# Patient Record
Sex: Female | Born: 1995 | Race: White | Hispanic: No | Marital: Single | Smoking: Never smoker
Health system: Southern US, Community
[De-identification: ages and names within clinical notes are randomized; demographics above are authoritative.]

## PROBLEM LIST (undated history)

## (undated) DIAGNOSIS — Z973 Presence of spectacles and contact lenses: Secondary | ICD-10-CM

## (undated) DIAGNOSIS — N6019 Diffuse cystic mastopathy of unspecified breast: Secondary | ICD-10-CM

## (undated) DIAGNOSIS — N200 Calculus of kidney: Secondary | ICD-10-CM

## (undated) DIAGNOSIS — D563 Thalassemia minor: Secondary | ICD-10-CM

## (undated) DIAGNOSIS — N281 Cyst of kidney, acquired: Secondary | ICD-10-CM

## (undated) DIAGNOSIS — F32A Depression, unspecified: Secondary | ICD-10-CM

## (undated) DIAGNOSIS — F419 Anxiety disorder, unspecified: Secondary | ICD-10-CM

## (undated) DIAGNOSIS — F329 Major depressive disorder, single episode, unspecified: Secondary | ICD-10-CM

## (undated) HISTORY — DX: Presence of spectacles and contact lenses: Z97.3

## (undated) HISTORY — DX: Diffuse cystic mastopathy of unspecified breast: N60.19

## (undated) HISTORY — DX: Depression, unspecified: F32.A

## (undated) HISTORY — PX: TONSILLECTOMY: SUR1361

## (undated) HISTORY — DX: Cyst of kidney, acquired: N28.1

## (undated) HISTORY — DX: Thalassemia minor: D56.3

## (undated) HISTORY — DX: Calculus of kidney: N20.0

## (undated) HISTORY — DX: Anxiety disorder, unspecified: F41.9

## (undated) HISTORY — DX: Major depressive disorder, single episode, unspecified: F32.9

---
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
## 2015-04-17 ENCOUNTER — Telehealth: Payer: Self-pay | Admitting: Hematology

## 2015-04-17 NOTE — Telephone Encounter (Signed)
new patient appt-s/w patient and gave np appt for 09/23 w/10:15 arrival w/Dr. Irene Limbo.  Referring Dr. Ronny Bacon  Dx-Thalassemia    Referral information scanned into EPIC for review

## 2015-05-03 ENCOUNTER — Encounter: Payer: Self-pay | Admitting: Hematology

## 2015-05-03 ENCOUNTER — Ambulatory Visit (HOSPITAL_BASED_OUTPATIENT_CLINIC_OR_DEPARTMENT_OTHER): Payer: BLUE CROSS/BLUE SHIELD | Admitting: Hematology

## 2015-05-03 ENCOUNTER — Telehealth: Payer: Self-pay | Admitting: Hematology

## 2015-05-03 ENCOUNTER — Other Ambulatory Visit (HOSPITAL_BASED_OUTPATIENT_CLINIC_OR_DEPARTMENT_OTHER): Payer: BLUE CROSS/BLUE SHIELD

## 2015-05-03 VITALS — BP 126/79 | HR 89 | Temp 98.4°F | Resp 20 | Ht 60.0 in | Wt 141.7 lb

## 2015-05-03 DIAGNOSIS — D582 Other hemoglobinopathies: Secondary | ICD-10-CM

## 2015-05-03 DIAGNOSIS — Z8489 Family history of other specified conditions: Secondary | ICD-10-CM | POA: Diagnosis not present

## 2015-05-03 DIAGNOSIS — E559 Vitamin D deficiency, unspecified: Secondary | ICD-10-CM | POA: Diagnosis not present

## 2015-05-03 DIAGNOSIS — Z1501 Genetic susceptibility to malignant neoplasm of breast: Secondary | ICD-10-CM

## 2015-05-03 DIAGNOSIS — Z79899 Other long term (current) drug therapy: Secondary | ICD-10-CM

## 2015-05-03 DIAGNOSIS — R5383 Other fatigue: Secondary | ICD-10-CM | POA: Diagnosis not present

## 2015-05-03 LAB — CHCC SMEAR

## 2015-05-03 LAB — CBC & DIFF AND RETIC
BASO%: 0.5 % (ref 0.0–2.0)
BASOS ABS: 0 10*3/uL (ref 0.0–0.1)
EOS%: 0.8 % (ref 0.0–7.0)
Eosinophils Absolute: 0.1 10*3/uL (ref 0.0–0.5)
HEMATOCRIT: 34.9 % (ref 34.8–46.6)
HGB: 11 g/dL — ABNORMAL LOW (ref 11.6–15.9)
Immature Retic Fract: 8.4 % (ref 1.60–10.00)
LYMPH%: 29.4 % (ref 14.0–49.7)
MCH: 19.9 pg — AB (ref 25.1–34.0)
MCHC: 31.5 g/dL (ref 31.5–36.0)
MCV: 63.2 fL — AB (ref 79.5–101.0)
MONO#: 0.4 10*3/uL (ref 0.1–0.9)
MONO%: 4.8 % (ref 0.0–14.0)
NEUT#: 5 10*3/uL (ref 1.5–6.5)
NEUT%: 64.5 % (ref 38.4–76.8)
PLATELETS: 237 10*3/uL (ref 145–400)
RBC: 5.52 10*6/uL — ABNORMAL HIGH (ref 3.70–5.45)
RDW: 15.6 % — AB (ref 11.2–14.5)
RETIC %: 2.11 % — AB (ref 0.70–2.10)
Retic Ct Abs: 116.47 10*3/uL — ABNORMAL HIGH (ref 33.70–90.70)
WBC: 7.8 10*3/uL (ref 3.9–10.3)
lymph#: 2.3 10*3/uL (ref 0.9–3.3)

## 2015-05-03 LAB — COMPREHENSIVE METABOLIC PANEL (CC13)
ALK PHOS: 57 U/L (ref 40–150)
ALT: 16 U/L (ref 0–55)
ANION GAP: 8 meq/L (ref 3–11)
AST: 15 U/L (ref 5–34)
Albumin: 4.6 g/dL (ref 3.5–5.0)
BUN: 11.3 mg/dL (ref 7.0–26.0)
CALCIUM: 9.6 mg/dL (ref 8.4–10.4)
CHLORIDE: 107 meq/L (ref 98–109)
CO2: 27 mEq/L (ref 22–29)
Creatinine: 0.7 mg/dL (ref 0.6–1.1)
Glucose: 91 mg/dl (ref 70–140)
POTASSIUM: 4.1 meq/L (ref 3.5–5.1)
Sodium: 141 mEq/L (ref 136–145)
Total Bilirubin: 1.37 mg/dL — ABNORMAL HIGH (ref 0.20–1.20)
Total Protein: 7.4 g/dL (ref 6.4–8.3)

## 2015-05-03 LAB — IRON AND TIBC CHCC
%SAT: 51 % (ref 21–57)
Iron: 138 ug/dL (ref 41–142)
TIBC: 271 ug/dL (ref 236–444)
UIBC: 134 ug/dL (ref 120–384)

## 2015-05-03 LAB — FERRITIN CHCC: FERRITIN: 86 ng/mL (ref 9–269)

## 2015-05-03 LAB — TSH CHCC: TSH: 1.388 m(IU)/L (ref 0.308–3.960)

## 2015-05-03 NOTE — Telephone Encounter (Signed)
Pt confirmed labs/ov per 09/23 POF, gave pt AVS and Calendar... KJ °

## 2015-05-03 NOTE — Progress Notes (Signed)
Marland Kitchen    HEMATOLOGY/ONCOLOGY CONSULTATION NOTE  Date of Service: 05/03/2015  Patient Care Team: No Pcp Per Patient as PCP - General (General Practice)  CHIEF COMPLAINTS/PURPOSE OF CONSULTATION:  -Establishing care for her hemoglobinopathy -High iron level -Concern for family history of Li-Fraumeni syndrome  HISTORY OF PRESENTING ILLNESS:  Victoria Dawson is a wonderful 19 y.o. female who has been referred to [place] by her [employer] nurse for evaluation and management of her hemoglobinopathy, high iron level and concern for family history of Li-Fraumeni syndrome.  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 She notes that she has never received blood transfusions or IV iron. She notes that her dad also has thalassemia and is of [place]. 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 She has been noted to have fibrocystic breast disease as per her report. Notes some mild intermittent headaches but nothing that has been persistent. Wears glasses with corrective lenses.  She reports that her paternal aunt has Li-Fraumeni syndrome and has had ovarian cancer and Waldenstrm's macroglobulinemia. Paternal uncle had multiple myeloma. Paternal cousin with leukemia.   MEDICAL HISTORY:  Past Medical History  Diagnosis Date  . 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 Kidney stone     Past 3-4 years ago  . Kidney cyst, acquired   . Wears glasses   . Fibrocystic breast changes     SURGICAL HISTORY: Past Surgical History  Procedure Laterality Date  . Tonsillectomy      SOCIAL HISTORY: Social History   Social History  . Marital Status:  Single    Spouse Name: N/A  . Number of Children: N/A  . Years of Education: N/A   Occupational History  . Not on file.   Social History Main Topics  . Smoking status: Never Smoker   . Smokeless tobacco: Never Used  . Alcohol Use: No  . Drug Use: No  . Sexual Activity: Not Currently   Other Topics Concern  . Not on file   Social History Narrative    FAMILY HISTORY: Family History  Problem Relation Age of Onset  . Thalassemia Father   . Throat cancer Father     Reported to be HPV positive  . Li-Fraumeni syndrome Paternal Aunt   . Ovarian cancer Paternal Aunt   . Multiple myeloma Paternal Uncle   . Thalassemia Paternal Grandfather   . Dementia Paternal Grandfather   . Leukemia Cousin     Paternal cousin   Father results of [place] Mother is of [place] and has heart disease and hypertension no blood problems or cancers.  ALLERGIES:  has No Known Allergies.  MEDICATIONS:  No current outpatient prescriptions on file.   No current facility-administered medications for this visit.    REVIEW OF SYSTEMS:    10 Point review of Systems was done is negative except as noted above.  PHYSICAL EXAMINATION: ECOG PERFORMANCE STATUS: 0 - Asymptomatic  . Filed Vitals:   05/03/15 1040  Height: 5' (1.524 m)  Weight: 141 lb 11.2 oz (64.275 kg)   Filed Weights   05/03/15 1040  Weight: 141 lb 11.2  oz (64.275 kg)   .Body mass index is 27.67 kg/(m^2).  GENERAL:alert, in no acute distress and comfortable SKIN: skin color, texture, turgor are normal, no rashes or significant lesions EYES: normal, conjunctiva are pink and non-injected, sclera clear OROPHARYNX:no exudate, no erythema and lips, buccal mucosa, and tongue normal  NECK: supple, no JVD, thyroid normal size, non-tender, without nodularity LYMPH:  no palpable lymphadenopathy in the cervical, axillary or inguinal LUNGS: clear to auscultation with normal respiratory effort HEART: regular rate & rhythm,   no murmurs and no lower extremity edema ABDOMEN: abdomen soft, non-tender, normoactive bowel sounds , no hepatosplenomegaly. Musculoskeletal: no cyanosis of digits and no clubbing  PSYCH: alert & oriented x 3 with fluent speech NEURO: no focal motor/sensory deficits  LABORATORY DATA:  I have reviewed the data as listed  . CBC Latest Ref Rng 05/03/2015 07/18/2009  WBC 3.9 - 10.3 10e3/uL 7.8 12.4  Hemoglobin 11.6 - 15.9 g/dL 11.0(L) 12.1  Hematocrit 34.8 - 46.6 % 34.9 37.7  Platelets 145 - 400 10e3/uL 237 280   . CBC    Component Value Date/Time   WBC 7.8 05/03/2015 1156   WBC 12.4 07/18/2009 2030   RBC 5.52* 05/03/2015 1156   RBC 5.93* 07/18/2009 2030   HGB 11.0* 05/03/2015 1156   HGB 12.1 07/18/2009 2030   HCT 34.9 05/03/2015 1156   HCT 37.7 07/18/2009 2030   PLT 237 05/03/2015 1156   PLT 280 07/18/2009 2030   MCV 63.2* 05/03/2015 1156   MCV 63.6* 07/18/2009 2030   MCH 19.9* 05/03/2015 1156   MCHC 31.5 05/03/2015 1156   MCHC 32.2 07/18/2009 2030   RDW 15.6* 05/03/2015 1156   RDW 14.2 07/18/2009 2030   LYMPHSABS 2.3 05/03/2015 1156   LYMPHSABS 3.2 07/18/2009 2030   MONOABS 0.4 05/03/2015 1156   MONOABS 0.7 07/18/2009 2030   EOSABS 0.1 05/03/2015 1156   EOSABS 0.1 07/18/2009 2030   BASOSABS 0.0 05/03/2015 1156   BASOSABS 0.1 07/18/2009 2030    . CMP Latest Ref Rng 05/03/2015  Glucose 70 - 140 mg/dl 91  BUN 7.0 - 26.0 mg/dL 11.3  Creatinine 0.6 - 1.1 mg/dL 0.7  Sodium 136 - 145 mEq/L 141  Potassium 3.5 - 5.1 mEq/L 4.1  CO2 22 - 29 mEq/L 27  Calcium 8.4 - 10.4 mg/dL 9.6  Total Protein 6.4 - 8.3 g/dL 7.4  Total Bilirubin 0.20 - 1.20 mg/dL 1.37(H)  Alkaline Phos 40 - 150 U/L 57  AST 5 - 34 U/L 15  ALT 0 - 55 U/L 16    . Lab Results  Component Value Date   IRON 138 05/03/2015   TIBC 271 05/03/2015   IRONPCTSAT 51 05/03/2015   (Iron and TIBC)  Lab Results  Component Value Date   FERRITIN 86 05/03/2015   Component     Latest Ref Rng 05/03/2015  TSH      0.308 - 3.960 m(IU)/L 1.388  Vit D, 25-Hydroxy     30 - 100 ng/mL 20 (L)  Vitamin B-12     211 - 911 pg/mL 338  RBC Folate     >280 ng/mL 751   Peripheral Blood smear 04/02/2015: Reviewed by me  Microcytic RBCs with anisocytosis and poikilocytosis and mild polychromasia. Adequate platelets. No platelet clumping. Normal myeloid maturation. No increased blasts.   RADIOGRAPHIC STUDIES: I have personally reviewed the radiological images as listed and agreed with the findings in the report. No results found.  ASSESSMENT & PLAN:   19 year old female of Italian/Irish  ancestry with  1] Microcytic relative polycythemia. With the overall mild anemia hemoglobin of 11 with an MCV in the low 60s. 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 Patient does not have significant anemia at this time. She has never received/required blood transfusions. Never received IV iron. Her ferritin levels are 86 and did not suggest any signs of iron overload at this time. She has not been taking folic acid and with an increase RDW there is a concern that she might be deficient lower levels do not suggest such. B12 levels low normal. Plan -We will recommended the patient continue folic acid 1 mg per day as well as 1 tablet little B complex daily. -Maintain adequate dietary oral iron intake. -Would indeed genetic counseling when she decides to conceive so that her partner is appropriately tested as well.  2] patient reports family history of Li-Fraumeni syndrome in her paternal aunt was a history of ovarian cancer and Waldenstrom's macroglobulinemia. Plan - Patient has been referred to the genetic counselor for appropriate evaluation and testing as indicated. -If she does have a genetic evidence of Li-Fraumeni syndrome we will need to set her up for high risk screening. -Her other family members including her sister might also need to be screened that situation.  #3] vitamin D  deficiency -We'll replace vitamin D with ergocalciferol 50,000 units weekly for 8 doses.  #4] anxiety related to her family genetic history and possibility of having a cancer genetics syndrome. -It appears that it is quite important to the patient to rule out the presence of any cancer genetics syndrome. She has been referred to the genetic counselor for further evaluation. -Recommended maintaining good physical activity level and having open communication with her parents to help with College related stress.  Return to care with Dr. Irene Limbo in 6 weeks to discuss the results of all the above tests and genetic testing results.  Patient's sister will accompanying her for this clinic visit. I answered their list of questions to their apparent satisfaction.  All of the patients questions were answered with apparent satisfaction. The patient knows to call the clinic with any problems, questions or concerns.  I spent 45 minutes counseling the patient face to face. The total time spent in the appointment was 60 minutes and more than 50% was on counseling and direct patient cares.    Sullivan Lone MD [place] [facility]IVMS [facility] Hematology/Oncology Physician [place]  (Office):       [phone] (Work cell):  [phone] (Fax):           [phone]  05/03/2015 10:56 AM

## 2015-05-07 LAB — VITAMIN B12: Vitamin B-12: 338 pg/mL (ref 211–911)

## 2015-05-07 LAB — HEMOGLOBINOPATHY EVALUATION
HGB A: 94.4 % — AB (ref 96.8–97.8)
HGB F QUANT: 0.4 % (ref 0.0–2.0)
HGB S QUANTITAION: 0 %
Hemoglobin Other: 0 %
Hgb A2 Quant: 5.2 % — ABNORMAL HIGH (ref 2.2–3.2)

## 2015-05-07 LAB — VITAMIN D 25 HYDROXY (VIT D DEFICIENCY, FRACTURES): Vit D, 25-Hydroxy: 20 ng/mL — ABNORMAL LOW (ref 30–100)

## 2015-05-07 LAB — FOLATE RBC: RBC Folate: 751 ng/mL (ref >280)

## 2015-05-09 ENCOUNTER — Telehealth: Payer: Self-pay | Admitting: Hematology

## 2015-05-09 NOTE — Telephone Encounter (Signed)
Faxed medical records to [facility] @ [phone]

## 2015-05-10 ENCOUNTER — Encounter: Payer: Self-pay | Admitting: Hematology

## 2015-05-10 MED ORDER — FOLIC ACID 1 MG PO TABS: 1.0000 mg | ORAL_TABLET | Freq: Every day | ORAL | Status: DC

## 2015-05-10 MED ORDER — B COMPLEX VITAMINS PO CAPS: 1.0000 | ORAL_CAPSULE | Freq: Every day | ORAL | Status: DC

## 2015-05-10 MED ORDER — ERGOCALCIFEROL 1.25 MG (50000 UT) PO CAPS
50000.0000 [IU] | ORAL_CAPSULE | ORAL | Status: DC
Start: 2015-05-10 — End: 2015-11-05

## 2015-05-13 ENCOUNTER — Telehealth: Payer: Self-pay | Admitting: *Deleted

## 2015-05-13 ENCOUNTER — Telehealth: Payer: Self-pay | Admitting: Hematology

## 2015-05-13 NOTE — Telephone Encounter (Signed)
per pof to sch pt appt-cld & left pt a message of time & date of sch appt

## 2015-05-13 NOTE — Telephone Encounter (Signed)
Called pt and lvm regarding 3 new rx called into pharmacy by Dr. Irene Limbo.  Instructed to call for any questions.

## 2015-05-27 ENCOUNTER — Telehealth: Payer: Self-pay | Admitting: Genetic Counselor

## 2015-05-27 NOTE — Telephone Encounter (Signed)
LM on VM to see if we could r/s her appointment for 11 AM.  Left CB instructions.

## 2015-05-28 ENCOUNTER — Telehealth: Payer: Self-pay | Admitting: Hematology

## 2015-05-28 NOTE — Telephone Encounter (Signed)
FAXED OVER LAST NOTE (05/03/15) TO [facility]

## 2015-06-03 ENCOUNTER — Encounter: Payer: BLUE CROSS/BLUE SHIELD | Admitting: Genetic Counselor

## 2015-06-03 ENCOUNTER — Other Ambulatory Visit: Payer: BLUE CROSS/BLUE SHIELD

## 2015-06-10 ENCOUNTER — Telehealth: Payer: Self-pay

## 2015-06-10 NOTE — Telephone Encounter (Signed)
Pt calling for interpretation of multiple labs drawn on 05/03/15.

## 2015-06-14 ENCOUNTER — Telehealth: Payer: Self-pay | Admitting: Hematology

## 2015-06-14 NOTE — Telephone Encounter (Signed)
pt left voicemail to see about upcoming appt-gave pttime & dtae of appt

## 2015-06-17 ENCOUNTER — Telehealth: Payer: Self-pay | Admitting: Hematology

## 2015-06-17 ENCOUNTER — Ambulatory Visit (HOSPITAL_BASED_OUTPATIENT_CLINIC_OR_DEPARTMENT_OTHER): Payer: BLUE CROSS/BLUE SHIELD | Admitting: Hematology

## 2015-06-17 ENCOUNTER — Encounter: Payer: Self-pay | Admitting: Hematology

## 2015-06-17 VITALS — BP 136/81 | HR 99 | Temp 98.1°F | Resp 20 | Ht 60.0 in | Wt 146.9 lb

## 2015-06-17 DIAGNOSIS — F419 Anxiety disorder, unspecified: Secondary | ICD-10-CM

## 2015-06-17 DIAGNOSIS — E559 Vitamin D deficiency, unspecified: Secondary | ICD-10-CM | POA: Diagnosis not present

## 2015-06-17 DIAGNOSIS — D563 Thalassemia minor: Secondary | ICD-10-CM | POA: Diagnosis not present

## 2015-06-17 DIAGNOSIS — R5383 Other fatigue: Secondary | ICD-10-CM

## 2015-06-17 NOTE — Telephone Encounter (Signed)
No follow up appointment on 11/7 pof at this time. Patient will call either [facility] or [facility] to establish primary care.

## 2015-07-10 NOTE — Progress Notes (Signed)
Victoria Dawson    HEMATOLOGY/ONCOLOGY CLINIC NOTE  Date of Service:  .06/17/2015  Patient Care Team: No Pcp Per Patient as PCP - General (General Practice)  CHIEF COMPLAINTS/PURPOSE OF CONSULTATION:   F/u for hemoglobinopathy and FHx of ?[name] Syndrome  HPI (Plz see initial consultation for details of initial presenttion)  INTERVAL HISTORY  Patient is here for here scheduled followup. 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 She was noted to be somewhat deficient in vitamin D and is being replaced for this. She also notes that her primary concern recently has been rodent infestation and her rendered apartment and wonders if that can cause any infections. She also appears to have fair amount of studies related stress. She notes that she did get an appointment for the genetic counselor and decided not to proceed with genetic testing at this time. She understands the pros and cons of not pursuing genetic testing. She currently has been getting her primary care through the student medical clinic but would like to set up a primary care physician for which she was given a referral.  MEDICAL HISTORY:  Past Medical History  Diagnosis Date  . 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 Kidney stone     Past 3-4 years ago  . Kidney cyst, acquired   . Wears glasses   . Fibrocystic breast changes     SURGICAL HISTORY: Past Surgical History  Procedure Laterality Date  . Tonsillectomy      SOCIAL HISTORY: Social History   Social History  . Marital Status: Single    Spouse Name: N/A  . Number of Children: N/A  . Years of Education: N/A   Occupational History  . Not on file.   Social History Main Topics  . Smoking status: Never Smoker   . Smokeless tobacco: Never Used  . Alcohol Use: No  . Drug Use: No  . Sexual Activity: Not Currently   Other Topics Concern  . Not on file   Social History Narrative     FAMILY HISTORY: Family History  Problem Relation Age of Onset  . Thalassemia Father   . Throat cancer Father     Reported to be HPV positive  . Li-Fraumeni syndrome Paternal Aunt   . Ovarian cancer Paternal Aunt   . Multiple myeloma Paternal Uncle   . Thalassemia Paternal Grandfather   . Dementia Paternal Grandfather   . Leukemia Cousin     Paternal cousin   Father results of [place] descent Mother is of [place] descent and has heart disease and hypertension no blood problems or cancers.  ALLERGIES:  has No Known Allergies.  MEDICATIONS:  Current Outpatient Prescriptions  Medication Sig Dispense Refill  . b complex vitamins capsule Take 1 capsule by mouth daily. 60 capsule 6  . ergocalciferol (VITAMIN D2) 50000 UNITS capsule Take 1 capsule (50,000 Units total) by mouth once a week. 8 capsule 0  . folic acid (FOLVITE) 1 MG tablet Take 1 tablet (1 mg total) by mouth daily. 60 tablet 6   No current facility-administered medications for this visit.    REVIEW OF SYSTEMS:    10 Point review of Systems was done is negative except as noted above.  PHYSICAL EXAMINATION: ECOG PERFORMANCE STATUS: 0 - Asymptomatic  . Filed Vitals:   06/17/15 1625  Height: 5' (1.524 m)  Weight: 146 lb 14.4 oz (66.633 kg)   Filed Weights   06/17/15 1625  Weight:  146 lb 14.4 oz (66.633 kg)   .Body mass index is 28.69 kg/(m^2).  GENERAL:alert, in no acute distress and comfortable SKIN: skin color, texture, turgor are normal, no rashes or significant lesions EYES: normal, conjunctiva are pink and non-injected, sclera clear OROPHARYNX:no exudate, no erythema and lips, buccal mucosa, and tongue normal  NECK: supple, no JVD, thyroid normal size, non-tender, without nodularity LYMPH:  no palpable lymphadenopathy in the cervical, axillary or inguinal LUNGS: clear to auscultation with normal respiratory effort HEART: regular rate & rhythm,  no murmurs and no lower extremity edema ABDOMEN: abdomen  soft, non-tender, normoactive bowel sounds , no hepatosplenomegaly. Musculoskeletal: no cyanosis of digits and no clubbing  PSYCH: alert & oriented x 3 with fluent speech NEURO: no focal motor/sensory deficits  LABORATORY DATA:  I have reviewed the data as listed  . CBC Latest Ref Rng 05/03/2015 07/18/2009  WBC 3.9 - 10.3 10e3/uL 7.8 12.4  Hemoglobin 11.6 - 15.9 g/dL 11.0(L) 12.1  Hematocrit 34.8 - 46.6 % 34.9 37.7  Platelets 145 - 400 10e3/uL 237 280   . CMP Latest Ref Rng 05/03/2015  Glucose 70 - 140 mg/dl 91  BUN 7.0 - 26.0 mg/dL 11.3  Creatinine 0.6 - 1.1 mg/dL 0.7  Sodium 136 - 145 mEq/L 141  Potassium 3.5 - 5.1 mEq/L 4.1  CO2 22 - 29 mEq/L 27  Calcium 8.4 - 10.4 mg/dL 9.6  Total Protein 6.4 - 8.3 g/dL 7.4  Total Bilirubin 0.20 - 1.20 mg/dL 1.37(H)  Alkaline Phos 40 - 150 U/L 57  AST 5 - 34 U/L 15  ALT 0 - 55 U/L 16    Lab Results  Component Value Date   IRON 138 05/03/2015   TIBC 271 05/03/2015   IRONPCTSAT 51 05/03/2015   (Iron and TIBC)  Lab Results  Component Value Date   FERRITIN 86 05/03/2015   Component     Latest Ref Rng 05/03/2015  TSH     0.308 - 3.960 m(IU)/L 1.388  Vit D, 25-Hydroxy     30 - 100 ng/mL 20 (L)  Vitamin B-12     211 - 911 pg/mL 338  RBC Folate     >280 ng/mL 751      RADIOGRAPHIC STUDIES: I have personally reviewed the radiological images as listed and agreed with the findings in the report. No results found.  ASSESSMENT & PLAN:   19 year old female of Italian/Irish ancestry with  1] Microcytic relative polycythemia. With the overall mild anemia hemoglobin of 11 with an MCV in the low 60s. Hemoglobin electrophoresis is consistent with beta thalassemia trait. Patient does not have significant anemia at this time. She has never received/required blood transfusions. Never received IV iron. Her ferritin levels are 86 and did not suggest any signs of iron overload at this time. She has not been taking folic acid and with an  increase RDW there is a concern that she might be deficient lower levels do not suggest such. B12 levels low normal. Plan -continue folic acid 1 mg per day as well as 1 tablet little B complex daily. -Maintain adequate dietary oral iron intake. -Would indeed genetic counseling when she decides to conceive so that her partner is appropriately tested as well. -Patient will need a primary care physician for continued follow-up and management. Referral has been given for this. 2] patient reports family history of Li-Fraumeni syndrome in her paternal aunt was a history of ovarian cancer and Waldenstrom's macroglobulinemia. Plan - Patient has been referred to the genetic  counselor for appropriate evaluation and testing. After initially being agreeable to do genetic testing she then decided she would not like to proceed with this. She understands the risks of not being screened for this high-risk situation with regards to lack of adequate screening, failure to diagnose early an associated cancer if she did have the mutation etc. she notes that she will think about it in the future after some of her stress from college is better under control but does not want to do this time. She has our contact information in case she changes her mind.  #3] vitamin D deficiency -We'll replace vitamin D with ergocalciferol 50,000 units weekly for 8 doses.  #4] anxiety related to her family genetic history and possibility of having a cancer genetics syndrome. Patient has declined genetic testing at this time but will let us know if she would like this. -Continue to encourage increase physical activity level.  She has been recommended to set up a primary care physician for her ongoing medical care's.  Return to care with Dr. Irene Limbo as needed or if she decides that she would like to proceed with genetic testing.   All of the patients questions were answered with apparent satisfaction. The patient knows to call the clinic  with any problems, questions or concerns.    Sullivan Lone MD [place] [facility]IVMS [facility] Hematology/Oncology Physician [place]  (Office):       [phone] (Work cell):  [phone] (Fax):           [phone]

## 2015-11-05 ENCOUNTER — Other Ambulatory Visit (INDEPENDENT_AMBULATORY_CARE_PROVIDER_SITE_OTHER): Payer: BLUE CROSS/BLUE SHIELD

## 2015-11-05 ENCOUNTER — Ambulatory Visit (INDEPENDENT_AMBULATORY_CARE_PROVIDER_SITE_OTHER): Payer: BLUE CROSS/BLUE SHIELD | Admitting: Internal Medicine

## 2015-11-05 ENCOUNTER — Encounter: Payer: Self-pay | Admitting: Internal Medicine

## 2015-11-05 VITALS — BP 120/60 | HR 78 | Temp 98.4°F | Resp 12 | Ht 60.0 in | Wt 144.0 lb

## 2015-11-05 DIAGNOSIS — Z0000 Encounter for general adult medical examination without abnormal findings: Secondary | ICD-10-CM

## 2015-11-05 DIAGNOSIS — D563 Thalassemia minor: Secondary | ICD-10-CM | POA: Diagnosis not present

## 2015-11-05 HISTORY — DX: Encounter for general adult medical examination without abnormal findings: Z00.00

## 2015-11-05 LAB — CBC
HCT: 34 % — ABNORMAL LOW (ref 36.0–46.0)
Hemoglobin: 10.8 g/dL — ABNORMAL LOW (ref 12.0–15.0)
MCHC: 31.6 g/dL (ref 30.0–36.0)
PLATELETS: 229 10*3/uL (ref 150.0–400.0)
RBC: 5.46 Mil/uL — ABNORMAL HIGH (ref 3.87–5.11)
RDW: 14.5 % (ref 11.5–14.6)
WBC: 7.5 10*3/uL (ref 4.5–10.5)

## 2015-11-05 LAB — COMPREHENSIVE METABOLIC PANEL
ALK PHOS: 37 U/L — AB (ref 39–117)
ALT: 8 U/L (ref 0–35)
AST: 13 U/L (ref 0–37)
Albumin: 4.6 g/dL (ref 3.5–5.2)
BILIRUBIN TOTAL: 0.7 mg/dL (ref 0.2–1.2)
BUN: 12 mg/dL (ref 6–23)
CO2: 27 meq/L (ref 19–32)
Calcium: 9.6 mg/dL (ref 8.4–10.5)
Chloride: 106 mEq/L (ref 96–112)
Creatinine, Ser: 0.75 mg/dL (ref 0.40–1.20)
GFR: 104.06 mL/min (ref >60.00)
GLUCOSE: 121 mg/dL — AB (ref 70–99)
Potassium: 4.4 mEq/L (ref 3.5–5.1)
SODIUM: 139 meq/L (ref 135–145)
TOTAL PROTEIN: 7.4 g/dL (ref 6.0–8.3)

## 2015-11-05 LAB — LIPID PANEL
CHOL/HDL RATIO: 4
Cholesterol: 193 mg/dL (ref 0–200)
HDL: 52.6 mg/dL (ref >39.00)
LDL Cholesterol: 118 mg/dL — ABNORMAL HIGH (ref 0–99)
NONHDL: 140.31
TRIGLYCERIDES: 111 mg/dL (ref 0.0–149.0)
VLDL: 22.2 mg/dL (ref 0.0–40.0)

## 2015-11-05 NOTE — Assessment & Plan Note (Signed)
Checking labs, BP normal, exercises regularly. Non-smoker. Talked to her about sun safety and the need for sunscreen or protective clothing.

## 2015-11-05 NOTE — Patient Instructions (Signed)
We will check the labs today and send the results on mychart.   Come back in about 2 years for a check up and call us sooner if anything pops up sooner.   Health Maintenance, Female Adopting a healthy lifestyle and getting preventive care can go a long way to promote health and wellness. Talk with your health care provider about what schedule of regular examinations is right for you. This is a good chance for you to check in with your provider about disease prevention and staying healthy. In between checkups, there are plenty of things you can do on your own. Experts have done a lot of research about which lifestyle changes and preventive measures are most likely to keep you healthy. Ask your health care provider for more information. WEIGHT AND DIET  Eat a healthy diet  Be sure to include plenty of vegetables, fruits, low-fat dairy products, and lean protein.  Do not eat a lot of foods high in solid fats, added sugars, or salt.  Get regular exercise. This is one of the most important things you can do for your health.  Most adults should exercise for at least 150 minutes each week. The exercise should increase your heart rate and make you sweat (moderate-intensity exercise).  Most adults should also do strengthening exercises at least twice a week. This is in addition to the moderate-intensity exercise.  Maintain a healthy weight  Body mass index (BMI) is a measurement that can be used to identify possible weight problems. It estimates body fat based on height and weight. Your health care provider can help determine your BMI and help you achieve or maintain a healthy weight.  For females 84 years of age and older:   A BMI below 18.5 is considered underweight.  A BMI of 18.5 to 24.9 is normal.  A BMI of 25 to 29.9 is considered overweight.  A BMI of 30 and above is considered obese.  Watch levels of cholesterol and blood lipids  You should start having your blood tested for  lipids and cholesterol at 21 years of age, then have this test every 5 years.  You may need to have your cholesterol levels checked more often if:  Your lipid or cholesterol levels are high.  You are older than 21 years of age.  You are at high risk for heart disease.  CANCER SCREENING   Lung Cancer  Lung cancer screening is recommended for adults 23-101 years old who are at high risk for lung cancer because of a history of smoking.  A yearly low-dose CT scan of the lungs is recommended for people who:  Currently smoke.  Have quit within the past 15 years.  Have at least a 30-pack-year history of smoking. A pack year is smoking an average of one pack of cigarettes a day for 1 year.  Yearly screening should continue until it has been 15 years since you quit.  Yearly screening should stop if you develop a health problem that would prevent you from having lung cancer treatment.  Breast Cancer  Practice breast self-awareness. This means understanding how your breasts normally appear and feel.  It also means doing regular breast self-exams. Let your health care provider know about any changes, no matter how small.  If you are in your 20s or 30s, you should have a clinical breast exam (CBE) by a health care provider every 1-3 years as part of a regular health exam.  If you are 40 or older, have  a CBE every year. Also consider having a breast X-ray (mammogram) every year.  If you have a family history of breast cancer, talk to your health care provider about genetic screening.  If you are at high risk for breast cancer, talk to your health care provider about having an MRI and a mammogram every year.  Breast cancer gene (BRCA) assessment is recommended for women who have family members with BRCA-related cancers. BRCA-related cancers include:  Breast.  Ovarian.  Tubal.  Peritoneal cancers.  Results of the assessment will determine the need for genetic counseling and BRCA1  and BRCA2 testing. Cervical Cancer Your health care provider may recommend that you be screened regularly for cancer of the pelvic organs (ovaries, uterus, and vagina). This screening involves a pelvic examination, including checking for microscopic changes to the surface of your cervix (Pap test). You may be encouraged to have this screening done every 3 years, beginning at age 13.  For women ages 66-65, health care providers may recommend pelvic exams and Pap testing every 3 years, or they may recommend the Pap and pelvic exam, combined with testing for human papilloma virus (HPV), every 5 years. Some types of HPV increase your risk of cervical cancer. Testing for HPV may also be done on women of any age with unclear Pap test results.  Other health care providers may not recommend any screening for nonpregnant women who are considered low risk for pelvic cancer and who do not have symptoms. Ask your health care provider if a screening pelvic exam is right for you.  If you have had past treatment for cervical cancer or a condition that could lead to cancer, you need Pap tests and screening for cancer for at least 20 years after your treatment. If Pap tests have been discontinued, your risk factors (such as having a new sexual partner) need to be reassessed to determine if screening should resume. Some women have medical problems that increase the chance of getting cervical cancer. In these cases, your health care provider may recommend more frequent screening and Pap tests. Colorectal Cancer  This type of cancer can be detected and often prevented.  Routine colorectal cancer screening usually begins at 21 years of age and continues through 21 years of age.  Your health care provider may recommend screening at an earlier age if you have risk factors for colon cancer.  Your health care provider may also recommend using home test kits to check for hidden blood in the stool.  A small camera at the  end of a tube can be used to examine your colon directly (sigmoidoscopy or colonoscopy). This is done to check for the earliest forms of colorectal cancer.  Routine screening usually begins at age 48.  Direct examination of the colon should be repeated every 5-10 years through 21 years of age. However, you may need to be screened more often if early forms of precancerous polyps or small growths are found. Skin Cancer  Check your skin from head to toe regularly.  Tell your health care provider about any new moles or changes in moles, especially if there is a change in a mole's shape or color.  Also tell your health care provider if you have a mole that is larger than the size of a pencil eraser.  Always use sunscreen. Apply sunscreen liberally and repeatedly throughout the day.  Protect yourself by wearing long sleeves, pants, a wide-brimmed hat, and sunglasses whenever you are outside. HEART DISEASE, DIABETES, AND HIGH  BLOOD PRESSURE   High blood pressure causes heart disease and increases the risk of stroke. High blood pressure is more likely to develop in:  People who have blood pressure in the high end of the normal range (130-139/85-89 mm Hg).  People who are overweight or obese.  People who are African American.  If you are 18-39 years of age, have your blood pressure checked every 3-5 years. If you are 40 years of age or older, have your blood pressure checked every year. You should have your blood pressure measured twice--once when you are at a hospital or clinic, and once when you are not at a hospital or clinic. Record the average of the two measurements. To check your blood pressure when you are not at a hospital or clinic, you can use:  An automated blood pressure machine at a pharmacy.  A home blood pressure monitor.  If you are between 55 years and 79 years old, ask your health care provider if you should take aspirin to prevent strokes.  Have regular diabetes  screenings. This involves taking a blood sample to check your fasting blood sugar level.  If you are at a normal weight and have a low risk for diabetes, have this test once every three years after 21 years of age.  If you are overweight and have a high risk for diabetes, consider being tested at a younger age or more often. PREVENTING INFECTION  Hepatitis B  If you have a higher risk for hepatitis B, you should be screened for this virus. You are considered at high risk for hepatitis B if:  You were born in a country where hepatitis B is common. Ask your health care provider which countries are considered high risk.  Your parents were born in a high-risk country, and you have not been immunized against hepatitis B (hepatitis B vaccine).  You have HIV or AIDS.  You use needles to inject street drugs.  You live with someone who has hepatitis B.  You have had sex with someone who has hepatitis B.  You get hemodialysis treatment.  You take certain medicines for conditions, including cancer, organ transplantation, and autoimmune conditions. Hepatitis C  Blood testing is recommended for:  Everyone born from 1945 through 1965.  Anyone with known risk factors for hepatitis C. Sexually transmitted infections (STIs)  You should be screened for sexually transmitted infections (STIs) including gonorrhea and chlamydia if:  You are sexually active and are younger than 21 years of age.  You are older than 21 years of age and your health care provider tells you that you are at risk for this type of infection.  Your sexual activity has changed since you were last screened and you are at an increased risk for chlamydia or gonorrhea. Ask your health care provider if you are at risk.  If you do not have HIV, but are at risk, it may be recommended that you take a prescription medicine daily to prevent HIV infection. This is called pre-exposure prophylaxis (PrEP). You are considered at risk  if:  You are sexually active and do not regularly use condoms or know the HIV status of your partner(s).  You take drugs by injection.  You are sexually active with a partner who has HIV. Talk with your health care provider about whether you are at high risk of being infected with HIV. If you choose to begin PrEP, you should first be tested for HIV. You should then be tested every   3 months for as long as you are taking PrEP.  PREGNANCY   If you are premenopausal and you may become pregnant, ask your health care provider about preconception counseling.  If you may become pregnant, take 400 to 800 micrograms (mcg) of folic acid every day.  If you want to prevent pregnancy, talk to your health care provider about birth control (contraception). OSTEOPOROSIS AND MENOPAUSE   Osteoporosis is a disease in which the bones lose minerals and strength with aging. This can result in serious bone fractures. Your risk for osteoporosis can be identified using a bone density scan.  If you are 55 years of age or older, or if you are at risk for osteoporosis and fractures, ask your health care provider if you should be screened.  Ask your health care provider whether you should take a calcium or vitamin D supplement to lower your risk for osteoporosis.  Menopause may have certain physical symptoms and risks.  Hormone replacement therapy may reduce some of these symptoms and risks. Talk to your health care provider about whether hormone replacement therapy is right for you.  HOME CARE INSTRUCTIONS   Schedule regular health, dental, and eye exams.  Stay current with your immunizations.   Do not use any tobacco products including cigarettes, chewing tobacco, or electronic cigarettes.  If you are pregnant, do not drink alcohol.  If you are breastfeeding, limit how much and how often you drink alcohol.  Limit alcohol intake to no more than 1 drink per day for nonpregnant women. One drink equals 12  ounces of beer, 5 ounces of wine, or 1 ounces of hard liquor.  Do not use street drugs.  Do not share needles.  Ask your health care provider for help if you need support or information about quitting drugs.  Tell your health care provider if you often feel depressed.  Tell your health care provider if you have ever been abused or do not feel safe at home.   This information is not intended to replace advice given to you by your health care provider. Make sure you discuss any questions you have with your health care provider.   Document Released: 02/09/2011 Document Revised: 08/17/2014 Document Reviewed: 06/28/2013 Elsevier Interactive Patient Education [employer].

## 2015-11-05 NOTE — Progress Notes (Signed)
   Subjective:    Patient ID: Victoria Dawson, female    DOB: 29-Aug-1995, 20 y.o.   MRN: [id number]  HPI The patient is a new 20 YO female coming in for wellness. No new complaints or concerns. 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   PMH, [facility], social history reviewed and updated.   Review of Systems  Constitutional: Negative for fever, activity change, appetite change, fatigue and unexpected weight change.  HENT: Negative.   Eyes: Negative.   Respiratory: Negative for cough, chest tightness, shortness of breath and wheezing.   Cardiovascular: Negative for chest pain, palpitations and leg swelling.  Gastrointestinal: Negative for nausea, abdominal pain, diarrhea, constipation and abdominal distention.  Musculoskeletal: Negative.   Skin: Negative.   Neurological: Negative.   Psychiatric/Behavioral: Negative.       Objective:   Physical Exam  Constitutional: She is oriented to person, place, and time. She appears well-developed and well-nourished.  HENT:  Head: Normocephalic and atraumatic.  Eyes: EOM are normal.  Neck: Normal range of motion.  Cardiovascular: Normal rate and regular rhythm.   Pulmonary/Chest: Effort normal and breath sounds normal. No respiratory distress. She has no wheezes. She has no rales.  Abdominal: Soft. Bowel sounds are normal. She exhibits no distension. There is no tenderness. There is no rebound.  Musculoskeletal: She exhibits no edema.  Neurological: She is alert and oriented to person, place, and time. Coordination normal.  Skin: Skin is warm and dry.  Psychiatric: She has a normal mood and affect.   Filed Vitals:   11/05/15 0858  BP: 120/60  Pulse: 78  Temp: 98.4 F (36.9 C)  TempSrc: Oral  Resp: 12  Height: 5' (1.524 m)  Weight: 144 lb (65.318 kg)  SpO2: 98%      Assessment & Plan:

## 2015-11-05 NOTE — Progress Notes (Signed)
Pre visit review using our clinic review tool, if applicable. No additional management support is needed unless otherwise documented below in the visit note. 

## 2015-11-05 NOTE — Assessment & Plan Note (Signed)
She is taking multivitamin and checking CBC today. No problems and no history of transfusion.

## 2015-11-06 LAB — HIV ANTIBODY (ROUTINE TESTING W REFLEX): HIV: NONREACTIVE

## 2016-01-30 ENCOUNTER — Other Ambulatory Visit: Payer: Self-pay | Admitting: Hematology

## 2016-02-18 ENCOUNTER — Ambulatory Visit: Payer: BLUE CROSS/BLUE SHIELD | Admitting: Internal Medicine

## 2016-02-18 ENCOUNTER — Telehealth: Payer: Self-pay | Admitting: Internal Medicine

## 2016-02-18 NOTE — Telephone Encounter (Signed)
Patient Name: Victoria Dawson NE DOB: 09-17-1995 Initial Comment Caller states she twice had a little blood in stool this morning, with nausea and abd pain. Nurse Assessment Nurse: Vallery Sa, RN, Cathy Date/Time (Eastern Time): 02/18/2016 8:55:08 AM Confirm and document reason for call. If symptomatic, describe symptoms. You must click the next button to save text entered. ---Caller states she developed lower bilateral abdominal pain this morning (rated as a 1/2 on the 1 to 10 scale earlier this morning, but no pain at this time). No injury in the past 3 days., No fever. She developed blood in her stool this morning. Alert and responsive. Has the patient traveled out of the country within the last 30 days? ---No Does the patient have any new or worsening symptoms? ---Yes Will a triage be completed? ---Yes Related visit to physician within the last 2 weeks? ---No Does the PT have any chronic conditions? (i.e. diabetes, asthma, etc.) ---Yes List chronic conditions. ---Thalasemmia Is the patient pregnant or possibly pregnant? (Ask all females between the ages of 1-55) ---No Is this a behavioral health or substance abuse call? ---No Guidelines Guideline Title Affirmed Question Affirmed Notes Rectal Bleeding MODERATE rectal bleeding (small blood clots, passing blood without stool, or toilet water turns red) Final Disposition User See Physician within 24 Hours [place], RN, [employer] states she has a 5pm appointment scheduled for tomorrow and she declined an earlier appointment at [facility]. Encouraged to call back wtih any concerns or questions. Referrals REFERRED TO PCP OFFICE Disagree/Comply: Comply

## 2016-02-19 ENCOUNTER — Ambulatory Visit: Payer: BLUE CROSS/BLUE SHIELD | Admitting: Internal Medicine

## 2016-07-26 ENCOUNTER — Emergency Department (HOSPITAL_COMMUNITY)
Admission: EM | Admit: 2016-07-26 | Discharge: 2016-07-27 | Disposition: A | Discharge status: Home / Self Care | Payer: BLUE CROSS/BLUE SHIELD | Attending: Emergency Medicine | Admitting: Emergency Medicine

## 2016-07-26 ENCOUNTER — Encounter (HOSPITAL_COMMUNITY): Payer: Self-pay

## 2016-07-26 ENCOUNTER — Emergency Department (HOSPITAL_COMMUNITY): Payer: BLUE CROSS/BLUE SHIELD

## 2016-07-26 DIAGNOSIS — R079 Chest pain, unspecified: Secondary | ICD-10-CM

## 2016-07-26 DIAGNOSIS — R072 Precordial pain: Secondary | ICD-10-CM | POA: Diagnosis not present

## 2016-07-26 LAB — BASIC METABOLIC PANEL
Anion gap: 8 (ref 5–15)
BUN: 8 mg/dL (ref 6–20)
CALCIUM: 10 mg/dL (ref 8.9–10.3)
CO2: 27 mmol/L (ref 22–32)
CREATININE: 0.66 mg/dL (ref 0.44–1.00)
Chloride: 105 mmol/L (ref 101–111)
GFR calc non Af Amer: >60 mL/min (ref >60)
Glucose, Bld: 92 mg/dL (ref 65–99)
Potassium: 3.6 mmol/L (ref 3.5–5.1)
Sodium: 140 mmol/L (ref 135–145)

## 2016-07-26 LAB — CBC
HCT: 34 % — ABNORMAL LOW (ref 36.0–46.0)
Hemoglobin: 11 g/dL — ABNORMAL LOW (ref 12.0–15.0)
MCH: 20 pg — AB (ref 26.0–34.0)
MCHC: 32.4 g/dL (ref 30.0–36.0)
MCV: 61.8 fL — ABNORMAL LOW (ref 78.0–100.0)
PLATELETS: 218 10*3/uL (ref 150–400)
RBC: 5.5 MIL/uL — AB (ref 3.87–5.11)
RDW: 14.8 % (ref 11.5–15.5)
WBC: 9.9 10*3/uL (ref 4.0–10.5)

## 2016-07-26 LAB — I-STAT TROPONIN, ED: TROPONIN I, POC: 0 ng/mL (ref 0.00–0.08)

## 2016-07-26 LAB — POC URINE PREG, ED: Preg Test, Ur: NEGATIVE

## 2016-07-26 MED ORDER — KETOROLAC TROMETHAMINE 30 MG/ML IJ SOLN
30.0000 mg | Freq: Once | INTRAMUSCULAR | Status: DC
  Filled 2016-07-26: qty 1

## 2016-07-26 NOTE — ED Triage Notes (Signed)
Pt complaining of central chest pressure and shortness of breath x 30 mins. Pt denies any cough or lightheadedness/dizziness. Pt denies any fevers.

## 2016-07-26 NOTE — ED Provider Notes (Signed)
[facility] DEPT Provider Note   CSN: [id number] Arrival date & time: 07/26/16  2152  History   Chief Complaint Chief Complaint  Patient presents with  . Chest Pain  . Shortness of Breath    HPI Victoria Dawson is a 20 y.o. female.  HPI  20 y.o. female with a hx beta thalassemia minor, presents to the Emergency Department today complaining of chest pain and shortness of breath with onset x 30 min ago. Pt states that she was folding laundry when she felt a sudden substernal chest pain that lasted 30 minutes. Noted dull ache that she rated 5/10 and constant. Had radiation into right side of neck that has remained. No syncope. No dizziness. No lightheadedness. No N/V. No diaphoresis. No hx same. No fevers. No URI symptoms. No trauma to area. No other symptoms noted. Pt states pain improving in ED without intervention.   Past Medical History:  Diagnosis Date  . Anxiety   . 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 Depression   . Fibrocystic breast changes   . Kidney cyst, acquired   . Kidney stone    Past 3-4 years ago  . Wears glasses     Patient Active Problem List   Diagnosis Date Noted  . Routine general medical examination at a health care facility 11/05/2015  . 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 TONSILLECTOMY      OB History    No data available       Home Medications    Prior to Admission medications   Medication Sig Start Date End Date Taking? Authorizing Provider  Norethin Ace-Eth Estrad-FE (BLISOVI 24 FE PO) Take by mouth.    Historical Provider, MD    Family History Family History  Problem Relation Age of Onset  . Thalassemia Father   . Throat cancer Father     Reported to be HPV positive  . Li-Fraumeni syndrome Paternal Aunt   . Ovarian cancer Paternal Aunt   . Leukemia Cousin     Paternal cousin  . Multiple myeloma Paternal Uncle   . Thalassemia Paternal  Grandfather   . Dementia Paternal Grandfather     Social History Social History  Substance Use Topics  . Smoking status: Never Smoker  . Smokeless tobacco: Never Used  . Alcohol use No     Allergies   Patient has no known allergies.   Review of Systems Review of Systems ROS reviewed and all are negative for acute change except as noted in the HPI.  Physical Exam Updated Vital Signs BP 142/92   Pulse 90   Temp 98.2 F (36.8 C) (Oral)   Resp 13   LMP 01/25/2016 Comment: irregular menstural periods/ on pill  SpO2 100%   Physical Exam  Constitutional: She is oriented to person, place, and time. Vital signs are normal. She appears well-developed and well-nourished.  HENT:  Head: Normocephalic and atraumatic.  Right Ear: Hearing normal.  Left Ear: Hearing normal.  Eyes: Conjunctivae and EOM are normal. Pupils are equal, round, and reactive to light.  Neck: Trachea normal, normal range of motion and full passive range of motion without pain. Neck supple. Normal carotid pulses and no JVD present. No tracheal tenderness present. Carotid bruit is not present.  Cardiovascular: Normal rate, regular rhythm, normal heart sounds and intact distal pulses.   Pulmonary/Chest: Effort normal and breath sounds normal. No respiratory distress. She  has no wheezes. She has no rales. She exhibits no tenderness.  Abdominal: Soft.  Musculoskeletal: Normal range of motion.  Neurological: She is alert and oriented to person, place, and time.  Skin: Skin is warm and dry.  Psychiatric: She has a normal mood and affect. Her speech is normal and behavior is normal. Thought content normal.  Nursing note and vitals reviewed.  ED Treatments / Results  Labs (all labs ordered are listed, but only abnormal results are displayed) Labs Reviewed  CBC - Abnormal; Notable for the following:       Result Value   RBC 5.50 (*)    Hemoglobin 11.0 (*)    HCT 34.0 (*)    MCV 61.8 (*)    MCH 20.0 (*)    All  other components within normal limits  BASIC METABOLIC PANEL  I-STAT TROPOININ, ED  POC URINE PREG, ED    EKG  EKG Interpretation None       Radiology Dg Chest 2 View  Result Date: 07/26/2016 CLINICAL DATA:  20 year old female with central chest pain. EXAM: CHEST  2 VIEW COMPARISON:  None. FINDINGS: The heart size and mediastinal contours are within normal limits. Both lungs are clear. The visualized skeletal structures are unremarkable. IMPRESSION: No active cardiopulmonary disease. Electronically Signed   By: Anner Crete M.D.   On: 07/26/2016 23:43    Procedures Procedures (including critical care time)  Medications Ordered in ED Medications - No data to display   Initial Impression / Assessment and Plan / ED Course  I have reviewed the triage vital signs and the nursing notes.  Pertinent labs & imaging results that were available during my care of the patient were reviewed by me and considered in my medical decision making (see chart for details).  Clinical Course    Final Clinical Impressions(s) / ED Diagnoses  {I have reviewed and evaluated the relevant laboratory values. {I have reviewed and evaluated the relevant imaging studies. {I have interpreted the relevant EKG. {I have reviewed the relevant previous healthcare records.  {I obtained HPI from historian.   ED Course:  Assessment: Pt is a 20yF presents with CP x 30 min ago. Resolving in ED without intervention. Risk Factors none. Does have hx Beta Thalassemia minor. Given toradol in ED. Patient is to be discharged with recommendation to follow up with PCP in regards to today's hospital visit. Chest pain is not likely of cardiac or pulmonary etiology d/t presentation, perc negative, VSS, no tracheal deviation, no JVD or new murmur, RRR, breath sounds equal bilaterally, EKG without acute abnormalities, negative troponin, and negative CXR. Heart Score 0. Likely musculoskeletal. Pt has been advised start a NSAIDs and  return to the ED is CP becomes exertional, associated with diaphoresis or nausea, radiates to left jaw/arm, worsens or becomes concerning in any way. Pt appears reliable for follow up and is agreeable to discharge. Patient is in no acute distress. Vital Signs are stable. Patient is able to ambulate. Patient able to tolerate PO.  Disposition/Plan:  DC Home Additional Verbal discharge instructions given and discussed with patient.  Pt Instructed to f/u with PCP in the next week for evaluation and treatment of symptoms. Return precautions given Pt acknowledges and agrees with plan  Supervising Physician Nat Christen, MD  Final diagnoses:  Chest pain, unspecified type    New Prescriptions New Prescriptions   No medications on file     Shary Decamp, PA-C 07/27/16 0015    Nat Christen, MD 07/31/16 [phone]

## 2016-07-27 MED ORDER — IBUPROFEN 600 MG PO TABS: 600.0000 mg | ORAL_TABLET | Freq: Four times a day (QID) | ORAL | 0 refills | Status: DC | PRN

## 2016-07-27 NOTE — Discharge Instructions (Signed)
Please read and follow all provided instructions.  Your diagnoses today include:  1. Chest pain, unspecified type     Tests performed today include: An EKG of your heart A chest x-ray Cardiac enzymes - a blood test for heart muscle damage Blood counts and electrolytes Vital signs. See below for your results today.   Medications prescribed:   Take any prescribed medications only as directed.  Follow-up instructions: Please follow-up with your primary care provider as soon as you can for further evaluation of your symptoms.   Return instructions:  SEEK IMMEDIATE MEDICAL ATTENTION IF: You have severe chest pain, especially if the pain is crushing or pressure-like and spreads to the arms, back, neck, or jaw, or if you have sweating, nausea (feeling sick to your stomach), or shortness of breath. THIS IS AN EMERGENCY. Don't wait to see if the pain will go away. Get medical help at once. Call 911 or 0 (operator). DO NOT drive yourself to the hospital.  Your chest pain gets worse and does not go away with rest.  You have an attack of chest pain lasting longer than usual, despite rest and treatment with the medications your caregiver has prescribed.  You wake from sleep with chest pain or shortness of breath. You feel dizzy or faint. You have chest pain not typical of your usual pain for which you originally saw your caregiver.  You have any other emergent concerns regarding your health.  Additional Information: Chest pain comes from many different causes. Your caregiver has diagnosed you as having chest pain that is not specific for one problem, but does not require admission.  You are at low risk for an acute heart condition or other serious illness.   Your vital signs today were: BP 132/92    Pulse 79    Temp 98.2 F (36.8 C) (Oral)    Resp 17    LMP 01/25/2016 Comment: irregular menstural periods/ on pill   SpO2 99%  If your blood pressure (BP) was elevated above 135/85 this visit,  please have this repeated by your doctor within one month. --------------

## 2016-11-13 ENCOUNTER — Ambulatory Visit (INDEPENDENT_AMBULATORY_CARE_PROVIDER_SITE_OTHER): Payer: BLUE CROSS/BLUE SHIELD | Admitting: Internal Medicine

## 2016-11-13 ENCOUNTER — Encounter: Payer: Self-pay | Admitting: Internal Medicine

## 2016-11-13 DIAGNOSIS — D563 Thalassemia minor: Secondary | ICD-10-CM | POA: Diagnosis not present

## 2016-11-13 DIAGNOSIS — Z0000 Encounter for general adult medical examination without abnormal findings: Secondary | ICD-10-CM | POA: Diagnosis not present

## 2016-11-13 NOTE — Assessment & Plan Note (Signed)
Stable, prior CBC consistent.

## 2016-11-13 NOTE — Patient Instructions (Signed)
Health Maintenance, Female Adopting a healthy lifestyle and getting preventive care can go a long way to promote health and wellness. Talk with your health care provider about what schedule of regular examinations is right for you. This is a good chance for you to check in with your provider about disease prevention and staying healthy. In between checkups, there are plenty of things you can do on your own. Experts have done a lot of research about which lifestyle changes and preventive measures are most likely to keep you healthy. Ask your health care provider for more information. Weight and diet Eat a healthy diet  Be sure to include plenty of vegetables, fruits, low-fat dairy products, and lean protein.  Do not eat a lot of foods high in solid fats, added sugars, or salt.  Get regular exercise. This is one of the most important things you can do for your health.  Most adults should exercise for at least 150 minutes each week. The exercise should increase your heart rate and make you sweat (moderate-intensity exercise).  Most adults should also do strengthening exercises at least twice a week. This is in addition to the moderate-intensity exercise. Maintain a healthy weight  Body mass index (BMI) is a measurement that can be used to identify possible weight problems. It estimates body fat based on height and weight. Your health care provider can help determine your BMI and help you achieve or maintain a healthy weight.  For females 76 years of age and older:  A BMI below 18.5 is considered underweight.  A BMI of 18.5 to 24.9 is normal.  A BMI of 25 to 29.9 is considered overweight.  A BMI of 30 and above is considered obese. Watch levels of cholesterol and blood lipids  You should start having your blood tested for lipids and cholesterol at 22 years of age, then have this test every 5 years.  You may need to have your cholesterol levels checked more often if:  Your lipid or  cholesterol levels are high.  You are older than 22 years of age.  You are at high risk for heart disease. Cancer screening Lung Cancer  Lung cancer screening is recommended for adults 64-42 years old who are at high risk for lung cancer because of a history of smoking.  A yearly low-dose CT scan of the lungs is recommended for people who:  Currently smoke.  Have quit within the past 15 years.  Have at least a 30-pack-year history of smoking. A pack year is smoking an average of one pack of cigarettes a day for 1 year.  Yearly screening should continue until it has been 15 years since you quit.  Yearly screening should stop if you develop a health problem that would prevent you from having lung cancer treatment. Breast Cancer  Practice breast self-awareness. This means understanding how your breasts normally appear and feel.  It also means doing regular breast self-exams. Let your health care provider know about any changes, no matter how small.  If you are in your 20s or 30s, you should have a clinical breast exam (CBE) by a health care provider every 1-3 years as part of a regular health exam.  If you are 34 or older, have a CBE every year. Also consider having a breast X-ray (mammogram) every year.  If you have a family history of breast cancer, talk to your health care provider about genetic screening.  If you are at high risk for breast cancer, talk  to your health care provider about having an MRI and a mammogram every year.  Breast cancer gene (BRCA) assessment is recommended for women who have family members with BRCA-related cancers. BRCA-related cancers include:  Breast.  Ovarian.  Tubal.  Peritoneal cancers.  Results of the assessment will determine the need for genetic counseling and BRCA1 and BRCA2 testing. Cervical Cancer  Your health care provider may recommend that you be screened regularly for cancer of the pelvic organs (ovaries, uterus, and vagina).  This screening involves a pelvic examination, including checking for microscopic changes to the surface of your cervix (Pap test). You may be encouraged to have this screening done every 3 years, beginning at age 24.  For women ages 66-65, health care providers may recommend pelvic exams and Pap testing every 3 years, or they may recommend the Pap and pelvic exam, combined with testing for human papilloma virus (HPV), every 5 years. Some types of HPV increase your risk of cervical cancer. Testing for HPV may also be done on women of any age with unclear Pap test results.  Other health care providers may not recommend any screening for nonpregnant women who are considered low risk for pelvic cancer and who do not have symptoms. Ask your health care provider if a screening pelvic exam is right for you.  If you have had past treatment for cervical cancer or a condition that could lead to cancer, you need Pap tests and screening for cancer for at least 20 years after your treatment. If Pap tests have been discontinued, your risk factors (such as having a new sexual partner) need to be reassessed to determine if screening should resume. Some women have medical problems that increase the chance of getting cervical cancer. In these cases, your health care provider may recommend more frequent screening and Pap tests. Colorectal Cancer  This type of cancer can be detected and often prevented.  Routine colorectal cancer screening usually begins at 22 years of age and continues through 22 years of age.  Your health care provider may recommend screening at an earlier age if you have risk factors for colon cancer.  Your health care provider may also recommend using home test kits to check for hidden blood in the stool.  A small camera at the end of a tube can be used to examine your colon directly (sigmoidoscopy or colonoscopy). This is done to check for the earliest forms of colorectal cancer.  Routine  screening usually begins at age 41.  Direct examination of the colon should be repeated every 5-10 years through 22 years of age. However, you may need to be screened more often if early forms of precancerous polyps or small growths are found. Skin Cancer  Check your skin from head to toe regularly.  Tell your health care provider about any new moles or changes in moles, especially if there is a change in a mole's shape or color.  Also tell your health care provider if you have a mole that is larger than the size of a pencil eraser.  Always use sunscreen. Apply sunscreen liberally and repeatedly throughout the day.  Protect yourself by wearing long sleeves, pants, a wide-brimmed hat, and sunglasses whenever you are outside. Heart disease, diabetes, and high blood pressure  High blood pressure causes heart disease and increases the risk of stroke. High blood pressure is more likely to develop in:  People who have blood pressure in the high end of the normal range (130-139/85-89 mm Hg).  People who are overweight or obese.  People who are African American.  If you are 59-24 years of age, have your blood pressure checked every 3-5 years. If you are 34 years of age or older, have your blood pressure checked every year. You should have your blood pressure measured twice-once when you are at a hospital or clinic, and once when you are not at a hospital or clinic. Record the average of the two measurements. To check your blood pressure when you are not at a hospital or clinic, you can use:  An automated blood pressure machine at a pharmacy.  A home blood pressure monitor.  If you are between 29 years and 60 years old, ask your health care provider if you should take aspirin to prevent strokes.  Have regular diabetes screenings. This involves taking a blood sample to check your fasting blood sugar level.  If you are at a normal weight and have a low risk for diabetes, have this test once  every three years after 22 years of age.  If you are overweight and have a high risk for diabetes, consider being tested at a younger age or more often. Preventing infection Hepatitis B  If you have a higher risk for hepatitis B, you should be screened for this virus. You are considered at high risk for hepatitis B if:  You were born in a country where hepatitis B is common. Ask your health care provider which countries are considered high risk.  Your parents were born in a high-risk country, and you have not been immunized against hepatitis B (hepatitis B vaccine).  You have HIV or AIDS.  You use needles to inject street drugs.  You live with someone who has hepatitis B.  You have had sex with someone who has hepatitis B.  You get hemodialysis treatment.  You take certain medicines for conditions, including cancer, organ transplantation, and autoimmune conditions. Hepatitis C  Blood testing is recommended for:  Everyone born from [birth year] through 1966.  Anyone with known risk factors for hepatitis C. Sexually transmitted infections (STIs)  You should be screened for sexually transmitted infections (STIs) including gonorrhea and chlamydia if:  You are sexually active and are younger than 22 years of age.  You are older than 22 years of age and your health care provider tells you that you are at risk for this type of infection.  Your sexual activity has changed since you were last screened and you are at an increased risk for chlamydia or gonorrhea. Ask your health care provider if you are at risk.  If you do not have HIV, but are at risk, it may be recommended that you take a prescription medicine daily to prevent HIV infection. This is called pre-exposure prophylaxis (PrEP). You are considered at risk if:  You are sexually active and do not regularly use condoms or know the HIV status of your partner(s).  You take drugs by injection.  You are sexually active with a partner  who has HIV. Talk with your health care provider about whether you are at high risk of being infected with HIV. If you choose to begin PrEP, you should first be tested for HIV. You should then be tested every 3 months for as long as you are taking PrEP. Pregnancy  If you are premenopausal and you may become pregnant, ask your health care provider about preconception counseling.  If you may become pregnant, take 400 to 800 micrograms (mcg) of folic acid  every day.  If you want to prevent pregnancy, talk to your health care provider about birth control (contraception). Osteoporosis and menopause  Osteoporosis is a disease in which the bones lose minerals and strength with aging. This can result in serious bone fractures. Your risk for osteoporosis can be identified using a bone density scan.  If you are 4 years of age or older, or if you are at risk for osteoporosis and fractures, ask your health care provider if you should be screened.  Ask your health care provider whether you should take a calcium or vitamin D supplement to lower your risk for osteoporosis.  Menopause may have certain physical symptoms and risks.  Hormone replacement therapy may reduce some of these symptoms and risks. Talk to your health care provider about whether hormone replacement therapy is right for you. Follow these instructions at home:  Schedule regular health, dental, and eye exams.  Stay current with your immunizations.  Do not use any tobacco products including cigarettes, chewing tobacco, or electronic cigarettes.  If you are pregnant, do not drink alcohol.  If you are breastfeeding, limit how much and how often you drink alcohol.  Limit alcohol intake to no more than 1 drink per day for nonpregnant women. One drink equals 12 ounces of beer, 5 ounces of wine, or 1 ounces of hard liquor.  Do not use street drugs.  Do not share needles.  Ask your health care provider for help if you need support  or information about quitting drugs.  Tell your health care provider if you often feel depressed.  Tell your health care provider if you have ever been abused or do not feel safe at home. This information is not intended to replace advice given to you by your health care provider. Make sure you discuss any questions you have with your health care provider. Document Released: 02/09/2011 Document Revised: 01/02/2016 Document Reviewed: 04/30/2015 Elsevier Interactive Patient Education  2017 [employer].

## 2016-11-13 NOTE — Progress Notes (Signed)
Pre visit review using our clinic review tool, if applicable. No additional management support is needed unless otherwise documented below in the visit note. 

## 2016-11-13 NOTE — Assessment & Plan Note (Signed)
Pap smear from gynecology, labs not needed today. Counseled about sun safety and mole surveillance. Given screening recommendations. Tetanus up to date. Declines flu shot.

## 2016-11-13 NOTE — Progress Notes (Signed)
   Subjective:    Patient ID: Victoria Dawson, female    DOB: October 20, 1995, 21 y.o.   MRN: [id number]  HPI The patient is a 21 YO female coming in for wellness. No new concerns. Some headaches which feel like her head is empty. Sometimes starts with late meals.   PMH, [facility], social history reviewed and updated.   Review of Systems  Constitutional: Negative.   HENT: Negative.   Eyes: Negative.   Respiratory: Negative for cough, chest tightness and shortness of breath.   Cardiovascular: Negative for chest pain, palpitations and leg swelling.  Gastrointestinal: Negative for abdominal distention, abdominal pain, constipation, diarrhea, nausea and vomiting.  Musculoskeletal: Negative.   Skin: Negative.   Neurological: Negative.   Psychiatric/Behavioral: Negative.       Objective:   Physical Exam  Constitutional: She is oriented to person, place, and time. She appears well-developed and well-nourished.  HENT:  Head: Normocephalic and atraumatic.  Eyes: EOM are normal.  Neck: Normal range of motion.  Cardiovascular: Normal rate and regular rhythm.   Pulmonary/Chest: Effort normal and breath sounds normal. No respiratory distress. She has no wheezes. She has no rales.  Abdominal: Soft. Bowel sounds are normal. She exhibits no distension. There is no tenderness. There is no rebound.  Musculoskeletal: She exhibits no edema.  Neurological: She is alert and oriented to person, place, and time. Coordination normal.  Skin: Skin is warm and dry.  Psychiatric: She has a normal mood and affect.   Vitals:   11/13/16 1434  BP: 120/68  Pulse: 86  Resp: 12  Temp: 98.2 F (36.8 C)  TempSrc: Oral  SpO2: 98%  Weight: 149 lb (67.6 kg)  Height: 5' (1.524 m)      Assessment & Plan:

## 2016-12-24 ENCOUNTER — Ambulatory Visit (INDEPENDENT_AMBULATORY_CARE_PROVIDER_SITE_OTHER): Payer: BLUE CROSS/BLUE SHIELD | Admitting: Nurse Practitioner

## 2016-12-24 ENCOUNTER — Other Ambulatory Visit (INDEPENDENT_AMBULATORY_CARE_PROVIDER_SITE_OTHER): Payer: BLUE CROSS/BLUE SHIELD

## 2016-12-24 ENCOUNTER — Encounter: Payer: Self-pay | Admitting: Nurse Practitioner

## 2016-12-24 VITALS — BP 120/74 | HR 105 | Temp 99.5°F | Ht 60.0 in | Wt 153.0 lb

## 2016-12-24 DIAGNOSIS — W57XXXA Bitten or stung by nonvenomous insect and other nonvenomous arthropods, initial encounter: Secondary | ICD-10-CM

## 2016-12-24 DIAGNOSIS — B351 Tinea unguium: Secondary | ICD-10-CM

## 2016-12-24 DIAGNOSIS — B081 Molluscum contagiosum: Secondary | ICD-10-CM | POA: Diagnosis not present

## 2016-12-24 LAB — HEPATIC FUNCTION PANEL
ALK PHOS: 52 U/L (ref 39–117)
ALT: 37 U/L — ABNORMAL HIGH (ref 0–35)
AST: 18 U/L (ref 0–37)
Albumin: 4.9 g/dL (ref 3.5–5.2)
BILIRUBIN DIRECT: 0.2 mg/dL (ref 0.0–0.3)
Total Bilirubin: 0.7 mg/dL (ref 0.2–1.2)
Total Protein: 7.6 g/dL (ref 6.0–8.3)

## 2016-12-24 MED ORDER — TERBINAFINE HCL 250 MG PO TABS: 250.0000 mg | ORAL_TABLET | Freq: Every day | ORAL | 2 refills | Status: DC

## 2016-12-24 NOTE — Progress Notes (Signed)
Subjective:  Patient ID: Victoria Dawson, female    DOB: August 30, 1995  Age: 21 y.o. MRN: [id number]  CC: Nail Problem (black spot on left big toe--going on for 2 mo/ lyme disease consult--alot of tick at her house?)   Rash  This is a new problem. The current episode started 1 to 4 weeks ago. The problem is unchanged. The affected locations include the right lower leg. She was exposed to nothing. Associated symptoms include nail changes. Pertinent negatives include no anorexia, congestion, cough, diarrhea, eye pain, facial edema, fatigue, fever, joint pain, rhinorrhea, shortness of breath, sore throat or vomiting. Past treatments include nothing. There is no history of allergies, asthma, eczema or varicella.   She will like to be tested for lyme disease. She has tick infestation in her home due [place] and outside pets. She suspects she has had a tick bite in past.  Nail discoloration: Onset 43months ago. Has been getting worse. No pain, redness or swelling. No nail injury. Sexually active with use of condoms. LMP 2weeks ago.  No outpatient prescriptions prior to visit.   No facility-administered medications prior to visit.     ROS See HPI  Objective:  BP 120/74   Pulse (!) 105   Temp 99.5 F (37.5 C)   Ht 5' (1.524 m)   Wt 153 lb (69.4 kg)   SpO2 94%   BMI 29.88 kg/m   BP Readings from Last 3 Encounters:  12/24/16 120/74  11/13/16 120/68  07/27/16 113/70    Wt Readings from Last 3 Encounters:  12/24/16 153 lb (69.4 kg)  11/13/16 149 lb (67.6 kg)  11/05/15 144 lb (65.3 kg)    Physical Exam  Constitutional: She is oriented to person, place, and time. No distress.  Cardiovascular: Normal rate and regular rhythm.   Pulmonary/Chest: Effort normal.  Musculoskeletal: She exhibits no edema or tenderness.  Neurological: She is alert and oriented to person, place, and time.  Skin: Skin is warm and dry. Rash noted. Rash is papular. No erythema.    Multiple Discrete pink rounded dome-shaped papules on right LE.  Left Great toe: hyperkeratosis, yellow discoloration, partial seperation from nailbed.  Vitals reviewed.   Lab Results  Component Value Date   WBC 9.9 07/26/2016   HGB 11.0 (L) 07/26/2016   HCT 34.0 (L) 07/26/2016   PLT 218 07/26/2016   GLUCOSE 92 07/26/2016   CHOL 193 11/05/2015   TRIG 111.0 11/05/2015   HDL 52.60 11/05/2015   LDLCALC 118 (H) 11/05/2015   ALT 8 11/05/2015   AST 13 11/05/2015   NA 140 07/26/2016   K 3.6 07/26/2016   CL 105 07/26/2016   CREATININE 0.66 07/26/2016   BUN 8 07/26/2016   CO2 27 07/26/2016   TSH 1.388 05/03/2015    Dg Chest 2 View  Result Date: 07/26/2016 CLINICAL DATA:  21 year old female with central chest pain. EXAM: CHEST  2 VIEW COMPARISON:  None. FINDINGS: The heart size and mediastinal contours are within normal limits. Both lungs are clear. The visualized skeletal structures are unremarkable. IMPRESSION: No active cardiopulmonary disease. Electronically Signed   By: Anner Crete M.D.   On: 07/26/2016 23:43    Assessment & Plan:   Victoria Dawson was seen today for nail problem.  Diagnoses and all orders for this visit:  Onychomycosis of left great toe -     terbinafine (LAMISIL) 250 MG tablet; Take 1 tablet (250 mg total) by mouth daily. -  Basic metabolic panel; Future -     Hepatic function panel; Future  Molluscum contagiosum -     Hepatic function panel; Future  Tick bite, initial encounter -     Lyme Ab/Western Blot Reflex   I am having Victoria Dawson start on terbinafine.  Meds ordered this encounter  Medications  . terbinafine (LAMISIL) 250 MG tablet    Sig: Take 1 tablet (250 mg total) by mouth daily.    Dispense:  30 tablet    Refill:  2    Order Specific Question:   Supervising Provider    Answer:   Cassandria Anger [[id number]]    Follow-up: Return in about 4 weeks (around 01/21/2017), or if symptoms worsen or fail to improve, for with  pcp.  Wilfred Lacy, NP

## 2016-12-24 NOTE — Patient Instructions (Addendum)
Return to lab for repeat hepatic panel in 1week.  Terbinafine tablets What is this medicine? TERBINAFINE (TER bin a feen) is an antifungal medicine. It is used to treat certain kinds of fungal or yeast infections. This medicine may be used for other purposes; ask your health care provider or pharmacist if you have questions. COMMON BRAND NAME(S): Lamisil, Terbinex What should I tell my health care provider before I take this medicine? They need to know if you have any of these conditions: -drink alcoholic beverages -kidney disease -liver disease -an unusual or allergic reaction to terbinafine, other medicines, foods, dyes, or preservatives -pregnant or trying to get pregnant -breast-feeding How should I use this medicine? Take this medicine by mouth with a full glass of water. Follow the directions on the prescription label. You can take this medicine with food or on an empty stomach. Take your medicine at regular intervals. Do not take your medicine more often than directed. Do not skip doses or stop your medicine early even if you feel better. Do not stop taking except on your doctor's advice. Talk to your pediatrician regarding the use of this medicine in children. Special care may be needed. Overdosage: If you think you have taken too much of this medicine contact a poison control center or emergency room at once. NOTE: This medicine is only for you. Do not share this medicine with others. What if I miss a dose? If you miss a dose, take it as soon as you can. If it is almost time for your next dose, take only that dose. Do not take double or extra doses. What may interact with this medicine? Do not take this medicine with any of the following medications: -thioridazine This medicine may also interact with the following medications: -beta-blockers -caffeine -cimetidine -cyclosporine -medicines for depression, anxiety, or psychotic disturbances -medicines for fungal infections like  fluconazole and ketoconazole -medicines for irregular heartbeat like amiodarone, flecainide and propafenone -rifampin -warfarin This list may not describe all possible interactions. Give your health care provider a list of all the medicines, herbs, non-prescription drugs, or dietary supplements you use. Also tell them if you smoke, drink alcohol, or use illegal drugs. Some items may interact with your medicine. What should I watch for while using this medicine? Visit your doctor or health care provider regularly. Tell your doctor right away if you have nausea or vomiting, loss of appetite, stomach pain on your right upper side, yellow skin, dark urine, light stools, or are over tired. Some fungal infections need many weeks or months of treatment to cure. If you are taking this medicine for a long time, you will need to have important blood work done. What side effects may I notice from receiving this medicine? Side effects that you should report to your doctor or health care professional as soon as possible: -allergic reactions like skin rash or hives, swelling of the face, lips, or tongue -changes in vision -dark urine -fever or infection -general ill feeling or flu-like symptoms -light-colored stools -loss of appetite, nausea -redness, blistering, peeling or loosening of the skin, including inside the mouth -right upper belly pain -unusually weak or tired -yellowing of the eyes or skin Side effects that usually do not require medical attention (report to your doctor or health care professional if they continue or are bothersome): -changes in taste -diarrhea -hair loss -muscle or joint pain -stomach gas -stomach upset This list may not describe all possible side effects. Call your doctor for medical advice about  side effects. You may report side effects to FDA at 1-800-FDA-1088. Where should I keep my medicine? Keep out of the reach of children. Store at room temperature below 25  degrees C (77 degrees F). Protect from light. Throw away any unused medicine after the expiration date. NOTE: This sheet is a summary. It may not cover all possible information. If you have questions about this medicine, talk to your doctor, pharmacist, or health care provider.  2018 Elsevier/Gold Standard (2007-10-07 16:28:07)

## 2016-12-25 LAB — LYME AB/WESTERN BLOT REFLEX

## 2016-12-31 ENCOUNTER — Other Ambulatory Visit (INDEPENDENT_AMBULATORY_CARE_PROVIDER_SITE_OTHER): Payer: BLUE CROSS/BLUE SHIELD

## 2016-12-31 DIAGNOSIS — B351 Tinea unguium: Secondary | ICD-10-CM | POA: Diagnosis not present

## 2016-12-31 LAB — BASIC METABOLIC PANEL
BUN: 8 mg/dL (ref 6–23)
CO2: 27 meq/L (ref 19–32)
Calcium: 9.6 mg/dL (ref 8.4–10.5)
Chloride: 103 mEq/L (ref 96–112)
Creatinine, Ser: 0.77 mg/dL (ref 0.40–1.20)
GFR: 99.83 mL/min (ref >60.00)
GLUCOSE: 129 mg/dL — AB (ref 70–99)
POTASSIUM: 3.6 meq/L (ref 3.5–5.1)
SODIUM: 136 meq/L (ref 135–145)

## 2016-12-31 LAB — HEPATIC FUNCTION PANEL
ALBUMIN: 4.7 g/dL (ref 3.5–5.2)
ALT: 16 U/L (ref 0–35)
AST: 14 U/L (ref 0–37)
Alkaline Phosphatase: 47 U/L (ref 39–117)
Bilirubin, Direct: 0.2 mg/dL (ref 0.0–0.3)
TOTAL PROTEIN: 7.2 g/dL (ref 6.0–8.3)
Total Bilirubin: 0.8 mg/dL (ref 0.2–1.2)

## 2017-01-05 ENCOUNTER — Encounter: Payer: Self-pay | Admitting: Family Medicine

## 2017-01-05 ENCOUNTER — Ambulatory Visit (INDEPENDENT_AMBULATORY_CARE_PROVIDER_SITE_OTHER): Payer: BLUE CROSS/BLUE SHIELD | Admitting: Family Medicine

## 2017-01-05 VITALS — BP 115/96 | HR 109 | Temp 98.6°F | Ht 60.0 in | Wt 152.0 lb

## 2017-01-05 DIAGNOSIS — K625 Hemorrhage of anus and rectum: Secondary | ICD-10-CM

## 2017-01-05 NOTE — Progress Notes (Signed)
   Subjective:    Patient ID: Victoria Dawson, female    DOB: Sep 06, 1995, 21 y.o.   MRN: [id number]  HPI Here to discuss one week of intermittent abdominal bloating, cramps, and passing loose stools. Then last night while passing a loose stool she saw some bright red blood in the toilet. The stool was easy to pass and she did not strain  . She notes that 2 weeks ago she started taking Terbinafine for toenail fungus and she has read it can have  GI side effects.    Review of Systems  Constitutional: Negative.   Respiratory: Negative.   Cardiovascular: Negative.   Gastrointestinal: Positive for abdominal pain, blood in stool and diarrhea. Negative for abdominal distention, constipation, nausea, rectal pain and vomiting.  Genitourinary: Negative.        Objective:   Physical Exam  Constitutional: She appears well-developed and well-nourished.  Cardiovascular: Normal rate, regular rhythm, normal heart sounds and intact distal pulses.   Pulmonary/Chest: Effort normal and breath sounds normal. She has no wheezes. She has no rales.  Abdominal: Soft. Bowel sounds are normal. She exhibits no distension and no mass. There is no tenderness. There is no rebound and no guarding.  Genitourinary:  Genitourinary Comments: Anus appears normal with no fissures or hemorrhoids           Assessment & Plan:  These may well be side effects of Terbinafine. We agreed for her to stop taking it. She will observe her symptoms for the next week or two and recheck if things do not improve.  Alysia Penna, MD

## 2017-01-05 NOTE — Patient Instructions (Signed)
WE NOW OFFER   [facility] FAST TRACK!!!  SAME DAY Appointments for ACUTE CARE  Such as: Sprains, Injuries, cuts, abrasions, rashes, muscle pain, joint pain, back pain Colds, flu, sore throats, headache, allergies, cough, fever  Ear pain, sinus and eye infections Abdominal pain, nausea, vomiting, diarrhea, upset stomach Animal/insect bites  3 Easy Ways to Schedule: Walk-In Scheduling Call in scheduling Mychart Sign-up: [email or web address]         

## 2017-01-07 ENCOUNTER — Ambulatory Visit: Payer: BLUE CROSS/BLUE SHIELD | Admitting: Internal Medicine

## 2017-01-07 ENCOUNTER — Ambulatory Visit: Payer: BLUE CROSS/BLUE SHIELD | Admitting: Adult Health

## 2017-01-08 ENCOUNTER — Telehealth: Payer: Self-pay | Admitting: Internal Medicine

## 2017-01-08 NOTE — Telephone Encounter (Signed)
Pt saw Dr Alysia Penna today and he suggested that she change from the terbinafine to something topical and told her to contact us to make the change... Please advise.

## 2017-01-12 NOTE — Telephone Encounter (Signed)
We did not prescribe or see her for this. Would recommend just to stop lamisil. Most topicals are not effective.

## 2017-01-12 NOTE — Telephone Encounter (Signed)
LVM with note and stated to call back if she has any questions

## 2017-01-28 ENCOUNTER — Encounter: Payer: Self-pay | Admitting: Family Medicine

## 2017-01-28 ENCOUNTER — Ambulatory Visit (INDEPENDENT_AMBULATORY_CARE_PROVIDER_SITE_OTHER): Payer: BLUE CROSS/BLUE SHIELD | Admitting: Family Medicine

## 2017-01-28 VITALS — BP 110/70 | HR 86 | Wt 153.8 lb

## 2017-01-28 DIAGNOSIS — Z7689 Persons encountering health services in other specified circumstances: Secondary | ICD-10-CM

## 2017-01-28 DIAGNOSIS — B351 Tinea unguium: Secondary | ICD-10-CM

## 2017-01-28 NOTE — Patient Instructions (Signed)
You will receive a call from [place].

## 2017-01-28 NOTE — Progress Notes (Signed)
   Subjective:    Patient ID: Victoria Dawson, female    DOB: 11/26/1995, 21 y.o.   MRN: [id number]  HPI Chief Complaint  Patient presents with  . new pt    new pt, toenail fungus. had this a couple months   She is here to establish care.  Previous medical care- Dr. Sharlet Salina at [facility] [place].   States she has a history of fungal infection to her left great toe for 3-4 months. Would like to know treatment options. States she had side effects to oral terbafine- headache and GI upset and these symptoms resolved after she stopped the medication 3 weeks ago.  States her previous PCP told her that topical antifungals do not work for her problem and she now wants to know what I recommend.   Denies fever, chills, N/V/D.   Reviewed allergies, medications, past medical, surgical,  and social history.   Review of Systems Pertinent positives and negatives in the history of present illness.     Objective:   Physical Exam BP 110/70   Pulse 86   Wt 153 lb 12.8 oz (69.8 kg)   BMI 30.04 kg/m   Left great toenail with yellowish discoloration and partial nail attachment. No erythema, edema or drainage. No other nail involvement.       Assessment & Plan:  Onychomycosis of left great toe - Plan: Ambulatory referral to Podiatry  Encounter to establish care  Discussed that she may try topical antifungal but this has not been proven to be effective in the past and is generally a lengthy process. She appears to have had some negative affects from oral terbafine so I do not recommend trying an oral medication at this point. Offered a referral to podiatry and she would like to proceed. Referral made.  She will follow up as needed.

## 2017-03-20 ENCOUNTER — Ambulatory Visit (INDEPENDENT_AMBULATORY_CARE_PROVIDER_SITE_OTHER): Payer: BLUE CROSS/BLUE SHIELD | Admitting: Family Medicine

## 2017-03-20 ENCOUNTER — Encounter: Payer: Self-pay | Admitting: Family Medicine

## 2017-03-20 VITALS — BP 112/70 | HR 89 | Temp 98.4°F | Wt 158.5 lb

## 2017-03-20 DIAGNOSIS — B081 Molluscum contagiosum: Secondary | ICD-10-CM | POA: Diagnosis not present

## 2017-03-20 DIAGNOSIS — L03115 Cellulitis of right lower limb: Secondary | ICD-10-CM

## 2017-03-20 DIAGNOSIS — L02415 Cutaneous abscess of right lower limb: Secondary | ICD-10-CM | POA: Insufficient documentation

## 2017-03-20 MED ORDER — CEPHALEXIN 500 MG PO CAPS: 500.0000 mg | ORAL_CAPSULE | Freq: Three times a day (TID) | ORAL | 0 refills | Status: DC

## 2017-03-20 NOTE — Patient Instructions (Addendum)
Wash with warm soapy water.  Do warm compresses 3-4 times daily.  Complete antibiotics.  Call if redness spreading or fever.   Cellulitis, Adult Cellulitis is a skin infection. The infected area is usually red and tender. This condition occurs most often in the arms and lower legs. The infection can travel to the muscles, blood, and underlying tissue and become serious. It is very important to get treated for this condition. What are the causes? Cellulitis is caused by bacteria. The bacteria enter through a break in the skin, such as a cut, burn, insect bite, open sore, or crack. What increases the risk? This condition is more likely to occur in people who:  Have a weak defense system (immune system).  Have open wounds on the skin such as cuts, burns, bites, and scrapes. Bacteria can enter the body through these open wounds.  Are older.  Have diabetes.  Have a type of long-lasting (chronic) liver disease (cirrhosis) or kidney disease.  Use IV drugs.  What are the signs or symptoms? Symptoms of this condition include:  Redness, streaking, or spotting on the skin.  Swollen area of the skin.  Tenderness or pain when an area of the skin is touched.  Warm skin.  Fever.  Chills.  Blisters.  How is this diagnosed? This condition is diagnosed based on a medical history and physical exam. You may also have tests, including:  Blood tests.  Lab tests.  Imaging tests.  How is this treated? Treatment for this condition may include:  Medicines, such as antibiotic medicines or antihistamines.  Supportive care, such as rest and application of cold or warm cloths (cold or warm compresses) to the skin.  Hospital care, if the condition is severe.  The infection usually gets better within 1-2 days of treatment. Follow these instructions at home:  Take over-the-counter and prescription medicines only as told by your health care provider.  If you were prescribed an  antibiotic medicine, take it as told by your health care provider. Do not stop taking the antibiotic even if you start to feel better.  Drink enough fluid to keep your urine clear or pale yellow.  Do not touch or rub the infected area.  Raise (elevate) the infected area above the level of your heart while you are sitting or lying down.  Apply warm or cold compresses to the affected area as told by your health care provider.  Keep all follow-up visits as told by your health care provider. This is important. These visits let your health care provider make sure a more serious infection is not developing. Contact a health care provider if:  You have a fever.  Your symptoms do not improve within 1-2 days of starting treatment.  Your bone or joint underneath the infected area becomes painful after the skin has healed.  Your infection returns in the same area or another area.  You notice a swollen bump in the infected area.  You develop new symptoms.  You have a general ill feeling (malaise) with muscle aches and pains. Get help right away if:  Your symptoms get worse.  You feel very sleepy.  You develop vomiting or diarrhea that persists.  You notice red streaks coming from the infected area.  Your red area gets larger or turns dark in color. This information is not intended to replace advice given to you by your health care provider. Make sure you discuss any questions you have with your health care provider. Document Released: 05/06/2005  Document Revised: 12/05/2015 Document Reviewed: 06/05/2015 Elsevier Interactive Patient Education  2017 [employer].

## 2017-03-20 NOTE — Progress Notes (Signed)
   Subjective:    Patient ID: Victoria Dawson, female    DOB: 30-Sep-1995, 20 y.o.   MRN: [id number]  HPI   21 year old female presents with new skin lesion.  She reports noting a skin lesion on right anterior lower leg..  looked like her typical molluscum contagiosum. In last week, she may have scratched the area.. Noted redness and pus, coming from lesion. Tender lesion, warm. Has noted yellow clear discharge, occ blood.  Cleaning with soap and water. HAs not put anything on the area.    No fever, no flu like symptoms.  Nml po intake.  No personal or family history of boils or MRSA.  No recent antibiotics. Does not work in [employer].  Social History /Family History/Past Medical History reviewed in detail and updated in EMR if needed. Blood pressure 112/70, pulse 89, temperature 98.4 F (36.9 C), temperature source Oral, weight 158 lb 8 oz (71.9 kg), last menstrual period 02/27/2017, SpO2 97 %.   Review of Systems  Constitutional: Negative for fatigue and fever.  HENT: Negative for ear pain.   Eyes: Negative for pain.  Respiratory: Negative for chest tightness and shortness of breath.   Cardiovascular: Negative for chest pain, palpitations and leg swelling.  Gastrointestinal: Negative for abdominal pain.  Genitourinary: Negative for dysuria.       Objective:   Physical Exam  Constitutional: Vital signs are normal. She appears well-developed and well-nourished. She is cooperative.  Non-toxic appearance. She does not appear ill. No distress.  HENT:  Head: Normocephalic.  Right Ear: Hearing, tympanic membrane, external ear and ear canal normal. Tympanic membrane is not erythematous, not retracted and not bulging.  Left Ear: Hearing, tympanic membrane, external ear and ear canal normal. Tympanic membrane is not erythematous, not retracted and not bulging.  Nose: No mucosal edema or rhinorrhea. Right sinus exhibits no maxillary sinus tenderness and no frontal sinus  tenderness. Left sinus exhibits no maxillary sinus tenderness and no frontal sinus tenderness.  Mouth/Throat: Uvula is midline, oropharynx is clear and moist and mucous membranes are normal.  Eyes: Pupils are equal, round, and reactive to light. Conjunctivae, EOM and lids are normal. Lids are everted and swept, no foreign bodies found.  Neck: Trachea normal and normal range of motion. Neck supple. Carotid bruit is not present. No thyroid mass and no thyromegaly present.  Cardiovascular: Normal rate, regular rhythm, S1 normal, S2 normal, normal heart sounds, intact distal pulses and normal pulses.  Exam reveals no gallop and no friction rub.   No murmur heard. Pulmonary/Chest: Effort normal and breath sounds normal. No tachypnea. No respiratory distress. She has no decreased breath sounds. She has no wheezes. She has no rhonchi. She has no rales.  Abdominal: Soft. Normal appearance and bowel sounds are normal. There is no tenderness.  Neurological: She is alert.  Skin: Skin is warm, dry and intact. Rash noted.   Molluscum lesion right anterior leg with surrounding erythema and warmth, no fluctuance, small scab superiorly.  3 cm diameter total of erythema.  Psychiatric: Her speech is normal and behavior is normal. Judgment and thought content normal. Her mood appears not anxious. Cognition and memory are normal. She does not exhibit a depressed mood.          Assessment & Plan:

## 2017-03-25 ENCOUNTER — Encounter: Payer: Self-pay | Admitting: Family

## 2017-03-25 ENCOUNTER — Ambulatory Visit (INDEPENDENT_AMBULATORY_CARE_PROVIDER_SITE_OTHER): Payer: BLUE CROSS/BLUE SHIELD | Admitting: Family

## 2017-03-25 VITALS — BP 128/82 | HR 99 | Temp 98.8°F | Resp 16 | Ht 60.0 in | Wt 159.0 lb

## 2017-03-25 DIAGNOSIS — K2100 Gastro-esophageal reflux disease with esophagitis, without bleeding: Secondary | ICD-10-CM

## 2017-03-25 DIAGNOSIS — Z833 Family history of diabetes mellitus: Secondary | ICD-10-CM | POA: Insufficient documentation

## 2017-03-25 LAB — POCT GLYCOSYLATED HEMOGLOBIN (HGB A1C): HEMOGLOBIN A1C: 4.9

## 2017-03-25 NOTE — Patient Instructions (Signed)
Thank you for choosing [facility].  SUMMARY AND INSTRUCTIONS:  Try either Pepid (Famotidine) or Zantac  TUMS as needed.  A1c was 4.9 which is exceptional!   Follow up:  If your symptoms worsen or fail to improve, please contact our office for further instruction, or in case of emergency go directly to the emergency room at the closest medical facility.    Heartburn Heartburn is a type of pain or discomfort that can happen in the throat or chest. It is often described as a burning pain. It may also cause a bad taste in the mouth. Heartburn may feel worse when you lie down or bend over. It may be caused by stomach contents that move back up (reflux) into the tube that connects the mouth with the stomach (esophagus). Follow these instructions at home: Take these actions to lessen your discomfort and to help avoid problems. Diet  Follow a diet as told by your doctor. You may need to avoid foods and drinks such as: ? Coffee and tea (with or without caffeine). ? Drinks that contain alcohol. ? Energy drinks and sports drinks. ? Carbonated drinks or sodas. ? Chocolate and cocoa. ? Peppermint and mint flavorings. ? Garlic and onions. ? Horseradish. ? Spicy and acidic foods, such as peppers, chili powder, curry powder, vinegar, hot sauces, and BBQ sauce. ? Citrus fruit juices and citrus fruits, such as oranges, lemons, and limes. ? Tomato-based foods, such as red sauce, chili, salsa, and pizza with red sauce. ? Fried and fatty foods, such as donuts, french fries, potato chips, and high-fat dressings. ? High-fat meats, such as hot dogs, rib eye steak, sausage, ham, and bacon. ? High-fat dairy items, such as whole milk, butter, and cream cheese.  Eat small meals often. Avoid eating large meals.  Avoid drinking large amounts of liquid with your meals.  Avoid eating meals during the 2-3 hours before bedtime.  Avoid lying down right after you eat.  Do not exercise right after  you eat. General instructions  Pay attention to any changes in your symptoms.  Take over-the-counter and prescription medicines only as told by your doctor. Do not take aspirin, ibuprofen, or other NSAIDs unless your doctor says it is okay.  Do not use any tobacco products, including cigarettes, chewing tobacco, and e-cigarettes. If you need help quitting, ask your doctor.  Wear loose clothes. Do not wear anything tight around your waist.  Raise (elevate) the head of your bed about 6 inches (15 cm).  Try to lower your stress. If you need help doing this, ask your doctor.  If you are overweight, lose an amount of weight that is healthy for you. Ask your doctor about a safe weight loss goal.  Keep all follow-up visits as told by your doctor. This is important. Contact a doctor if:  You have new symptoms.  You lose weight and you do not know why it is happening.  You have trouble swallowing, or it hurts to swallow.  You have wheezing or a cough that keeps happening.  Your symptoms do not get better with treatment.  You have heartburn often for more than two weeks. Get help right away if:  You have pain in your arms, neck, jaw, teeth, or back.  You feel sweaty, dizzy, or light-headed.  You have chest pain or shortness of breath.  You throw up (vomit) and your throw up looks like blood or coffee grounds.  Your poop (stool) is bloody or black. This information is  not intended to replace advice given to you by your health care provider. Make sure you discuss any questions you have with your health care provider. Document Released: 04/08/2011 Document Revised: 01/02/2016 Document Reviewed: 11/21/2014 Elsevier Interactive Patient Education  2018 [place] for Gastroesophageal Reflux Disease, Adult When you have gastroesophageal reflux disease (GERD), the foods you eat and your eating habits are very important. Choosing the right foods can help ease your  discomfort. What guidelines do I need to follow?  Choose fruits, vegetables, whole grains, and low-fat dairy products.  Choose low-fat meat, fish, and poultry.  Limit fats such as oils, salad dressings, butter, nuts, and avocado.  Keep a food diary. This helps you identify foods that cause symptoms.  Avoid foods that cause symptoms. These may be different for everyone.  Eat small meals often instead of 3 large meals a day.  Eat your meals slowly, in a place where you are relaxed.  Limit fried foods.  Cook foods using methods other than frying.  Avoid drinking alcohol.  Avoid drinking large amounts of liquids with your meals.  Avoid bending over or lying down until 2-3 hours after eating. What foods are not recommended? These are some foods and drinks that may make your symptoms worse: Vegetables Tomatoes. Tomato juice. Tomato and spaghetti sauce. Chili peppers. Onion and garlic. Horseradish. Fruits Oranges, grapefruit, and lemon (fruit and juice). Meats High-fat meats, fish, and poultry. This includes hot dogs, ribs, ham, sausage, salami, and bacon. Dairy Whole milk and chocolate milk. Sour cream. Cream. Butter. Ice cream. Cream cheese. Drinks Coffee and tea. Bubbly (carbonated) drinks or energy drinks. Condiments Hot sauce. Barbecue sauce. Sweets/Desserts Chocolate and cocoa. Donuts. Peppermint and spearmint. Fats and Oils High-fat foods. This includes [place] fries and potato chips. Other Vinegar. Strong spices. This includes black pepper, white pepper, red pepper, cayenne, curry powder, cloves, ginger, and chili powder. The items listed above may not be a complete list of foods and drinks to avoid. Contact your dietitian for more information. This information is not intended to replace advice given to you by your health care provider. Make sure you discuss any questions you have with your health care provider. Document Released: 01/26/2012 Document Revised:  01/02/2016 Document Reviewed: 05/31/2013 Elsevier Interactive Patient Education  2017 [employer].

## 2017-03-25 NOTE — Assessment & Plan Note (Signed)
New onset heartburn consistent with GERD. Recommend OTC second generation anti-histamine. If symptoms worsen or do not improve consider addition of a proton pump inhibitor.

## 2017-03-25 NOTE — Progress Notes (Signed)
Subjective:    Patient ID: Victoria Dawson, female    DOB: January 16, 1996, 21 y.o.   MRN: [id number]  Chief Complaint  Patient presents with  . Diabetes check    wants to be tested to diabetes, mom was recently diagnosed    HPI:  Victoria Dawson is a 21 y.o. female who  has a past medical history of Anxiety; Beta thalassemia minor; Depression; Fibrocystic breast changes; Kidney cyst, acquired; Kidney stone; and Wears glasses. and presents today for an office visit.    1.) Concern for diabetes - Mother was recently diagnosed with Type 2 diabetes and has concerns that she may also have diabetes and would like to have her A1c tested. Denies fatigue, excessive hunger, excessive thirst or increased urination.   2.) Dyspepsia - This is a new problem. Associated symptoms heartburn and dyspepsia have been going on for about 1 week. Describes a burning sensation located in her uper chest and throat that is exacerbated by food. Denies any modifying factors or attempted treatments. No abdominal pain, nausea, vomiting, constipation, diarrhea, or blood in stool.    No Known Allergies    Outpatient Medications Prior to Visit  Medication Sig Dispense Refill  . cephALEXin (KEFLEX) 500 MG capsule Take 1 capsule (500 mg total) by mouth 3 (three) times daily. 21 capsule 0   No facility-administered medications prior to visit.       Past Surgical History:  Procedure Laterality Date  . TONSILLECTOMY        Past Medical History:  Diagnosis Date  . Anxiety   . 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 Depression   . Fibrocystic breast changes   . Kidney cyst, acquired   . Kidney stone    Past 3-4 years ago  . Wears glasses       Review of Systems  Constitutional: Negative for chills, fatigue and fever.  HENT: Negative for congestion.   Respiratory: Negative for cough, chest tightness and shortness of breath.   Cardiovascular: Negative for chest pain,  palpitations and leg swelling.  Gastrointestinal: Negative for abdominal distention, abdominal pain, blood in stool, constipation, diarrhea, nausea and vomiting.  Endocrine: Negative for polydipsia, polyphagia and polyuria.  Neurological: Negative for headaches.      Objective:    BP 128/82 (BP Location: Left Arm, Patient Position: Sitting, Cuff Size: Normal)   Pulse 99   Temp 98.8 F (37.1 C) (Oral)   Resp 16   Ht 5' (1.524 m)   Wt 159 lb (72.1 kg)   LMP 02/27/2017   SpO2 97%   BMI 31.05 kg/m  Nursing note and vital signs reviewed.  Physical Exam  Constitutional: She is oriented to person, place, and time. She appears well-developed and well-nourished. No distress.  Cardiovascular: Normal rate, regular rhythm, normal heart sounds and intact distal pulses.   Pulmonary/Chest: Effort normal and breath sounds normal.  Abdominal: Normal appearance and bowel sounds are normal. She exhibits no mass. There is no hepatosplenomegaly. There is no tenderness. There is no rigidity, no guarding, no tenderness at McBurney's point and negative Murphy's sign.  Neurological: She is alert and oriented to person, place, and time.  Skin: Skin is warm and dry.  Psychiatric: She has a normal mood and affect. Her behavior is normal. Judgment and thought content normal.       Assessment & Plan:   Problem List Items Addressed This Visit      Digestive  Gastroesophageal reflux disease with esophagitis - Primary    New onset heartburn consistent with GERD. Recommend OTC second generation anti-histamine. If symptoms worsen or do not improve consider addition of a proton pump inhibitor.         Other   Family history of diabetes mellitus    Recent diagnosis of Type 2 diabetes for her mohter. In office A1c is 4.9 with no evidence of diabetes. Encouraged to continue eating a nutritional intake that is moderate, balanced and varied. Follow up as needed.       Relevant Orders   POCT HgB A1C  (Completed)       I am having Ms. Twaddle maintain her cephALEXin.   Follow-up: Return if symptoms worsen or fail to improve.  Mauricio Po, FNP

## 2017-03-25 NOTE — Assessment & Plan Note (Signed)
Recent diagnosis of Type 2 diabetes for her mohter. In office A1c is 4.9 with no evidence of diabetes. Encouraged to continue eating a nutritional intake that is moderate, balanced and varied. Follow up as needed.

## 2017-03-29 ENCOUNTER — Ambulatory Visit (INDEPENDENT_AMBULATORY_CARE_PROVIDER_SITE_OTHER): Payer: BLUE CROSS/BLUE SHIELD | Admitting: Family Medicine

## 2017-03-29 ENCOUNTER — Encounter: Payer: Self-pay | Admitting: Family Medicine

## 2017-03-29 VITALS — BP 120/80 | HR 94 | Temp 98.4°F | Ht 60.0 in | Wt 160.9 lb

## 2017-03-29 DIAGNOSIS — T8130XA Disruption of wound, unspecified, initial encounter: Secondary | ICD-10-CM

## 2017-03-29 NOTE — Progress Notes (Signed)
  HPI:  Acute visit for wound healing: -reports on abx for infected wound on R leg -today when pulled bandaide of it bled a little -overall she thinks it looks better -no pus, increased redness, increased swelling, fevers  ROS: See pertinent positives and negatives per HPI.  Past Medical History:  Diagnosis Date  . Anxiety   . 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 Depression   . Fibrocystic breast changes   . Kidney cyst, acquired   . Kidney stone    Past 3-4 years ago  . Wears glasses     Past Surgical History:  Procedure Laterality Date  . TONSILLECTOMY      Family History  Problem Relation Age of Onset  . Thalassemia Father   . Throat cancer Father        Reported to be HPV positive  . Li-Fraumeni syndrome Paternal Aunt   . Ovarian cancer Paternal Aunt   . Leukemia Cousin        Paternal cousin  . Multiple myeloma Paternal Uncle   . Thalassemia Paternal Grandfather   . Dementia Paternal Grandfather     Social History   Social History  . Marital status: Single    Spouse name: N/A  . Number of children: N/A  . Years of education: N/A   Social History Main Topics  . Smoking status: Never Smoker  . Smokeless tobacco: Never Used  . Alcohol use No  . Drug use: No  . Sexual activity: Not Currently   Other Topics Concern  . None   Social History Narrative  . None     Current Outpatient Prescriptions:  .  cephALEXin (KEFLEX) 500 MG capsule, Take 1 capsule (500 mg total) by mouth 3 (three) times daily., Disp: 21 capsule, Rfl: 0  EXAM:  Vitals:   03/29/17 1642  BP: 120/80  Pulse: 94  Temp: 98.4 F (36.9 C)    Body mass index is 31.42 kg/m.  GENERAL: vitals reviewed and listed above, alert, oriented, appears well hydrated and in no acute distress  HEENT: atraumatic, conjunttiva clear, no obvious abnormalities on inspection of external nose and ears  SKIN: very small healing lesion R ant leg below the knee - no signs infection,  looks like scab perhaps came off with small amount bleeding, good underlying tissue, no pus  MS: moves all extremities without noticeable abnormality  PSYCH: pleasant and cooperative, no obvious depression or anxiety  ASSESSMENT AND PLAN:  Discussed the following assessment and plan:  Wound disruption, initial encounter -looks to be healing well and it seems may have pulled off scar -Patient advised to return or notify a doctor immediately if pus, redness or swelling increased, persistent bleeding or oozing, not healing over the next 1-2 weeks or other concerns  There are no Patient Instructions on file for this visit.  Colin Benton R., DO

## 2017-07-20 ENCOUNTER — Ambulatory Visit: Payer: BLUE CROSS/BLUE SHIELD | Admitting: Internal Medicine

## 2017-07-22 ENCOUNTER — Ambulatory Visit: Payer: Self-pay | Admitting: Internal Medicine

## 2017-07-23 ENCOUNTER — Ambulatory Visit (INDEPENDENT_AMBULATORY_CARE_PROVIDER_SITE_OTHER): Payer: 59 | Admitting: Internal Medicine

## 2017-07-23 ENCOUNTER — Encounter: Payer: Self-pay | Admitting: Internal Medicine

## 2017-07-23 VITALS — BP 110/80 | HR 76 | Temp 98.1°F | Ht 60.0 in | Wt 163.0 lb

## 2017-07-23 DIAGNOSIS — L02415 Cutaneous abscess of right lower limb: Secondary | ICD-10-CM | POA: Diagnosis not present

## 2017-07-23 DIAGNOSIS — L03115 Cellulitis of right lower limb: Secondary | ICD-10-CM | POA: Diagnosis not present

## 2017-07-23 DIAGNOSIS — D229 Melanocytic nevi, unspecified: Secondary | ICD-10-CM | POA: Diagnosis not present

## 2017-07-23 NOTE — Progress Notes (Signed)
   Subjective:    Patient ID: Victoria Dawson, female    DOB: May 19, 1996, 21 y.o.   MRN: [id number]  HPI The patient is a 21 YO female coming in for a lesion on her right lower leg. Started about 2 weeks ago and turned into a boil with a large white head. At its worst had about a 5 cm ring of redness around it. This was 10/10 pain. The area started draining spontaneously and the pain decreased some. The redness has gradually gotten better since that time. She denies any injury to the area or recent shaving prior to starting. She now has no pain and small scab in the area.   Review of Systems  Constitutional: Negative.   Respiratory: Negative for cough, chest tightness and shortness of breath.   Cardiovascular: Negative for chest pain, palpitations and leg swelling.  Gastrointestinal: Negative for abdominal distention, abdominal pain, constipation, diarrhea, nausea and vomiting.  Musculoskeletal: Negative.   Skin: Positive for color change and wound.  Neurological: Negative.   Psychiatric/Behavioral: Negative.       Objective:   Physical Exam  Constitutional: She is oriented to person, place, and time. She appears well-developed and well-nourished.  HENT:  Head: Normocephalic and atraumatic.  Eyes: EOM are normal.  Neck: Normal range of motion.  Cardiovascular: Normal rate and regular rhythm.  Pulmonary/Chest: Effort normal and breath sounds normal. No respiratory distress. She has no wheezes. She has no rales.  Abdominal: Soft.  Musculoskeletal: She exhibits no edema.  Neurological: She is alert and oriented to person, place, and time. Coordination normal.  Skin: Skin is warm and dry.  Small scab on the lower right shin without surrounding erythema   Vitals:   07/23/17 0817  BP: 110/80  Pulse: 76  Temp: 98.1 F (36.7 C)  TempSrc: Oral  SpO2: 100%  Weight: 163 lb (73.9 kg)  Height: 5' (1.524 m)      Assessment & Plan:

## 2017-07-23 NOTE — Patient Instructions (Signed)
Hydrocortisone cream you can use for the stomach.

## 2017-07-23 NOTE — Assessment & Plan Note (Signed)
Resolved on its own, no need for antibiotics. Given the recurrence she would like to see dermatology for consult about her skin health. Referral placed today.

## 2017-08-26 ENCOUNTER — Encounter: Payer: Self-pay | Admitting: Podiatry

## 2017-08-26 ENCOUNTER — Ambulatory Visit (INDEPENDENT_AMBULATORY_CARE_PROVIDER_SITE_OTHER): Payer: 59 | Admitting: Podiatry

## 2017-08-26 VITALS — BP 112/76 | HR 91 | Resp 16

## 2017-08-26 DIAGNOSIS — L603 Nail dystrophy: Secondary | ICD-10-CM | POA: Diagnosis not present

## 2017-08-26 NOTE — Progress Notes (Signed)
  Subjective:  Patient ID: Victoria Dawson, female    DOB: Dec 30, 1995,  MRN: [id number] HPI Chief Complaint  Patient presents with  . Nail Problem    Hallux nail right - toenail discolored x 2 weeks, no injury, has taken an oral antifungal in the past and had bad reaction, some tenderness, no treatment recently    22 y.o. female presents with the above complaint.     Past Medical History:  Diagnosis Date  . Anxiety   . 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 Depression   . Fibrocystic breast changes   . Kidney cyst, acquired   . Kidney stone    Past 3-4 years ago  . Wears glasses    Past Surgical History:  Procedure Laterality Date  . TONSILLECTOMY      Current Outpatient Medications:  .  JUNEL FE 24 1-20 MG-MCG(24) tablet, , Disp: , Rfl:   No Known Allergies Review of Systems  All other systems reviewed and are negative.  Objective:   Vitals:   08/26/17 0838  BP: 112/76  Pulse: 91  Resp: 16    General: Well developed, nourished, in no acute distress, alert and oriented x3   Dermatological: Skin is warm, dry and supple bilateral. Nails x 10 are well maintained; remaining integument appears unremarkable at this time. There are no open sores, no preulcerative lesions, no rash or signs of infection present.  Subungual hematoma proximal most aspect of the right nail plate with the lateral margin of the nail plate draining the hematoma.  I trimmed a piece of the nail plate off and applied hydrogen peroxide and wiped it with a white sponge obviously its blood.  Vascular: Dorsalis Pedis artery and Posterior Tibial artery pedal pulses are 2/4 bilateral with immedate capillary fill time. Pedal hair growth present. No varicosities and no lower extremity edema present bilateral.   Neruologic: Grossly intact via light touch bilateral. Vibratory intact via tuning fork bilateral. Protective threshold with Semmes Wienstein monofilament intact to all pedal sites  bilateral. Patellar and Achilles deep tendon reflexes 2+ bilateral. No Babinski or clonus noted bilateral.   Musculoskeletal: No gross boney pedal deformities bilateral. No pain, crepitus, or limitation noted with foot and ankle range of motion bilateral. Muscular strength 5/5 in all groups tested bilateral.  Gait: Unassisted, Nonantalgic.    Radiographs:  None taken.  Assessment & Plan:   Assessment: Subungual hematoma hallux nail plate right.  Plan: This should resolve on its own.  If it does not she will notify us immediately.     Channin Agustin T. [place], [place]

## 2018-11-01 ENCOUNTER — Other Ambulatory Visit: Payer: Self-pay

## 2018-11-01 ENCOUNTER — Encounter: Payer: Self-pay | Admitting: Internal Medicine

## 2018-11-01 ENCOUNTER — Ambulatory Visit (INDEPENDENT_AMBULATORY_CARE_PROVIDER_SITE_OTHER): Payer: 59 | Admitting: Internal Medicine

## 2018-11-01 ENCOUNTER — Ambulatory Visit: Payer: 59 | Admitting: Family Medicine

## 2018-11-01 VITALS — BP 140/90 | HR 90 | Temp 98.2°F | Ht 60.0 in | Wt 166.0 lb

## 2018-11-01 DIAGNOSIS — R109 Unspecified abdominal pain: Secondary | ICD-10-CM

## 2018-11-01 HISTORY — DX: Unspecified abdominal pain: R10.9

## 2018-11-01 LAB — POCT URINALYSIS DIPSTICK
Bilirubin, UA: NEGATIVE
Glucose, UA: NEGATIVE
KETONES UA: NEGATIVE
LEUKOCYTES UA: NEGATIVE
NITRITE UA: NEGATIVE
PROTEIN UA: NEGATIVE
RBC UA: NEGATIVE
SPEC GRAV UA: 1.02 (ref 1.010–1.025)
UROBILINOGEN UA: 0.2 U/dL
pH, UA: 6 (ref 5.0–8.0)

## 2018-11-01 MED ORDER — TRAMADOL HCL 50 MG PO TABS: 50.0000 mg | ORAL_TABLET | Freq: Three times a day (TID) | ORAL | 0 refills | Status: DC | PRN

## 2018-11-01 NOTE — Patient Instructions (Signed)
We have sent in tramadol in case this is needed.   Drink extra water or fluids to help flush out a kidney stone.  If not going away in 1-2 weeks or getting worse let us know.

## 2018-11-01 NOTE — Assessment & Plan Note (Signed)
Could be kidney stone. Given coronavirus outbreak imaging is not available and this is not emergent. No concern for appendicitis. U/A done in the office which is not consistent with urinary infection. Rx for tramadol if needed for pain management. If no improvement in 1-2 weeks or worsening call back sooner. We talked about symptoms of appendicitis or acute abdomen and when she should seek care in ER.

## 2018-11-01 NOTE — Progress Notes (Signed)
   Subjective:   Patient ID: Victoria Dawson, female    DOB: [birth year], 23 y.o.   MRN: [id number]  HPI The patient is a 23 YO female coming in for right lower abdomen pain which radiates to the back. Started at [facility] this morning. Is 4-5/10 pain. Has tried nothing for it. Denies fevers or chills. Having some mild nausea and 1 episode diarrhea this morning. Having pain in the low stomach/flank and back. Overall it is stable since onset. Worse with twisting or moving. Had kidney stone several years ago and this did feel the same. She denies change in hydration recently.   Review of Systems  Constitutional: Positive for appetite change. Negative for activity change, diaphoresis, fatigue and fever.  HENT: Negative.   Eyes: Negative.   Respiratory: Negative for cough, chest tightness and shortness of breath.   Cardiovascular: Negative for chest pain, palpitations and leg swelling.  Gastrointestinal: Positive for abdominal pain, diarrhea and nausea. Negative for abdominal distention, anal bleeding, blood in stool, constipation, rectal pain and vomiting.  Musculoskeletal: Positive for back pain. Negative for arthralgias, joint swelling, myalgias and neck stiffness.  Skin: Negative.   Neurological: Negative.   Psychiatric/Behavioral: Negative.     Objective:  Physical Exam Constitutional:      Appearance: She is well-developed.  HENT:     Head: Normocephalic and atraumatic.  Neck:     Musculoskeletal: Normal range of motion.  Cardiovascular:     Rate and Rhythm: Normal rate and regular rhythm.  Pulmonary:     Effort: Pulmonary effort is normal. No respiratory distress.     Breath sounds: Normal breath sounds. No wheezing or rales.  Abdominal:     General: Bowel sounds are normal. There is no distension.     Palpations: Abdomen is soft.     Tenderness: There is abdominal tenderness. There is no rebound.     Comments: Mild tenderness right lower flank, no rebound or guarding, BS  normal  Skin:    General: Skin is warm and dry.  Neurological:     Mental Status: She is alert and oriented to person, place, and time.     Coordination: Coordination normal.     Vitals:   11/01/18 1030  BP: 140/90  Pulse: 90  Temp: 98.2 F (36.8 C)  TempSrc: Oral  SpO2: 98%  Weight: 166 lb (75.3 kg)  Height: 5' (1.524 m)    Assessment & Plan:

## 2019-01-25 ENCOUNTER — Telehealth: Payer: 59 | Admitting: Family

## 2019-01-25 DIAGNOSIS — Z20822 Contact with and (suspected) exposure to covid-19: Secondary | ICD-10-CM

## 2019-01-25 MED ORDER — BENZONATATE 100 MG PO CAPS: 100.0000 mg | ORAL_CAPSULE | Freq: Three times a day (TID) | ORAL | 0 refills | Status: DC | PRN

## 2019-01-25 MED ORDER — ALBUTEROL SULFATE HFA 108 (90 BASE) MCG/ACT IN AERS: 2.0000 | INHALATION_SPRAY | Freq: Four times a day (QID) | RESPIRATORY_TRACT | 0 refills | Status: DC | PRN

## 2019-01-25 NOTE — Progress Notes (Signed)
E-Visit for Corona Virus Screening   Your current symptoms could be consistent with the coronavirus.  Call your health care provider or local health department to request and arrange formal testing. Many health care providers can now test patients at their office but not all are.  Please quarantine yourself while awaiting your test results.  [place] [phone], [place], [place] [phone] or visit [email or web address]  and You have been enrolled in [place] for COVID-19.  Daily you will receive a questionnaire within the [facility] website. Our COVID-19 response team will be monitoring your responses daily.  Approximately 5 minutes was spent documenting and reviewing patient's chart.    COVID-19 is a respiratory illness with symptoms that are similar to the flu. Symptoms are typically mild to moderate, but there have been cases of severe illness and death due to the virus. The following symptoms may appear 2-14 days after exposure: . Fever . Cough . Shortness of breath or difficulty breathing . Chills . Repeated shaking with chills . Muscle pain . Headache . Sore throat . New loss of taste or smell . Fatigue . Congestion or runny nose . Nausea or vomiting . Diarrhea  It is vitally important that if you feel that you have an infection such as this virus or any other virus that you stay home and away from places where you may spread it to others.  You should self-quarantine for 14 days if you have symptoms that could potentially be coronavirus or have been in close contact a with a person diagnosed with COVID-19 within the last 2 weeks. You should avoid contact with people age 35 and older.   You should wear a mask or cloth face covering over your nose and mouth if you must be around other people or animals, including pets (even at  home). Try to stay at least 6 feet away from other people. This will protect the people around you.  You can use medication such as A prescription cough medication called Tessalon Perles 100 mg. You may take 1-2 capsules every 8 hours as needed for cough and A prescription inhaler called Albuterol MDI 90 mcg /actuation 2 puffs every 4 hours as needed for shortness of breath, wheezing, cough.  You may also take acetaminophen (Tylenol) as needed for fever.   Reduce your risk of any infection by using the same precautions used for avoiding the common cold or flu:  Marland Kitchen Wash your hands often with soap and warm water for at least 20 seconds.  If soap and water are not readily available, use an alcohol-based hand sanitizer with at least 60% alcohol.  . If coughing or sneezing, cover your mouth and nose by coughing or sneezing into the elbow areas of your shirt or coat, into a tissue or into your sleeve (not your hands). . Avoid shaking hands with others and consider head nods or verbal greetings only. . Avoid touching your eyes, nose, or mouth with unwashed hands.  . Avoid close contact with people who are sick. . Avoid places or events with large numbers of people in one location, like concerts or sporting events. . Carefully consider travel plans you have or are making. . If you are planning any travel outside or inside the [place], visit the CDC's Travelers' Health webpage for the latest health notices. . If you have some symptoms but not all symptoms, continue to monitor at home and seek medical attention if your symptoms  worsen. . If you are having a medical emergency, call 911.  HOME CARE . Only take medications as instructed by your medical team. . Drink plenty of fluids and get plenty of rest. . A steam or ultrasonic humidifier can help if you have congestion.   GET HELP RIGHT AWAY IF YOU HAVE EMERGENCY WARNING SIGNS** FOR COVID-19. If you or someone is showing any of these signs seek emergency  medical care immediately. Call 911 or proceed to your closest emergency facility if: . You develop worsening high fever. . Trouble breathing . Bluish lips or face . Persistent pain or pressure in the chest . New confusion . Inability to wake or stay awake . You cough up blood. . Your symptoms become more severe  **This list is not all possible symptoms. Contact your medical provider for any symptoms that are sever or concerning to you.   MAKE SURE YOU   Understand these instructions.  Will watch your condition.  Will get help right away if you are not doing well or get worse.  Your e-visit answers were reviewed by a board certified advanced clinical practitioner to complete your personal care plan.  Depending on the condition, your plan could have included both over the counter or prescription medications.  If there is a problem please reply once you have received a response from your provider.  Your safety is important to [place].  If you have drug allergies check your prescription carefully.    You can use MyChart to ask questions about today's visit, request a non-urgent call back, or ask for a work or school excuse for 24 hours related to this e-Visit. If it has been greater than 24 hours you will need to follow up with your provider, or enter a new e-Visit to address those concerns. You will get an e-mail in the next two days asking about your experience.  I hope that your e-visit has been valuable and will speed your recovery. Thank you for using e-visits.

## 2019-01-26 ENCOUNTER — Ambulatory Visit: Payer: Self-pay | Admitting: Internal Medicine

## 2019-01-26 ENCOUNTER — Telehealth: Payer: Self-pay | Admitting: *Deleted

## 2019-01-26 ENCOUNTER — Ambulatory Visit (INDEPENDENT_AMBULATORY_CARE_PROVIDER_SITE_OTHER): Payer: 59 | Admitting: Internal Medicine

## 2019-01-26 ENCOUNTER — Telehealth: Payer: Self-pay

## 2019-01-26 ENCOUNTER — Other Ambulatory Visit: Payer: Self-pay

## 2019-01-26 ENCOUNTER — Encounter: Payer: Self-pay | Admitting: Internal Medicine

## 2019-01-26 DIAGNOSIS — R0789 Other chest pain: Secondary | ICD-10-CM | POA: Diagnosis not present

## 2019-01-26 DIAGNOSIS — R05 Cough: Secondary | ICD-10-CM

## 2019-01-26 DIAGNOSIS — Z20822 Contact with and (suspected) exposure to covid-19: Secondary | ICD-10-CM | POA: Insufficient documentation

## 2019-01-26 DIAGNOSIS — R0602 Shortness of breath: Secondary | ICD-10-CM | POA: Diagnosis not present

## 2019-01-26 NOTE — Telephone Encounter (Signed)
Pt scheduled for covid testing on 01/27/19 @ [facility]. Instructions given and order placed

## 2019-01-26 NOTE — Telephone Encounter (Signed)
Last couple of days feeling shortness of breath.  No fever.  Sneezing and stuffy.   I need an order to be tested from my doctor I was told before I could be tested.  No other symptoms.  I warm transferred her call to Dr. Nathanial Millman office to be scheduled.   Sam took her call.

## 2019-01-26 NOTE — Progress Notes (Signed)
Virtual Visit via Video Note  I connected with Victoria Dawson on 01/26/19 at  2:40 PM EDT by a video enabled telemedicine application and verified that I am speaking with the correct person using two identifiers.  The patient and the provider were at separate locations throughout the entire encounter.   I discussed the limitations of evaluation and management by telemedicine and the availability of in person appointments. The patient expressed understanding and agreed to proceed.  History of Present Illness: The patient is a 23 y.o. female with visit for new SOB and tightness in chest. She has had some chills today. Does not have thermometer to check temp. She denies cough. She has been sneezing more and some mild allergy drainage/congestion. Started about 1-2 days ago. Has no known covid-19 positive contacts. Overall it is stable to mild worsening. Has tried nothing  Observations/Objective: Appearance: normal, breathing appears normal, no dyspnea during conversation, casual grooming, abdomen does not appear distended, throat mild drainage, mental status is A and O times 3  Assessment and Plan: See problem oriented charting  Follow Up Instructions: needs covid-19 testing for SOB and tightness in chest with some chills  I discussed the assessment and treatment plan with the patient. The patient was provided an opportunity to ask questions and all were answered. The patient agreed with the plan and demonstrated an understanding of the instructions.   The patient was advised to call back or seek an in-person evaluation if the symptoms worsen or if the condition fails to improve as anticipated.  Hoyt Koch, MD

## 2019-01-26 NOTE — Assessment & Plan Note (Signed)
Needs testing for Covid-19 given symptoms. Asked to quarantine until results.

## 2019-01-26 NOTE — Telephone Encounter (Signed)
Patient is having SOB and Chest tightness Dr. Sharlet Salina would like for her to be tested

## 2019-01-27 ENCOUNTER — Other Ambulatory Visit: Payer: Self-pay

## 2019-01-27 DIAGNOSIS — Z20822 Contact with and (suspected) exposure to covid-19: Secondary | ICD-10-CM

## 2019-02-01 LAB — NOVEL CORONAVIRUS, NAA: SARS-CoV-2, NAA: NOT DETECTED

## 2019-05-01 ENCOUNTER — Ambulatory Visit (INDEPENDENT_AMBULATORY_CARE_PROVIDER_SITE_OTHER): Payer: 59 | Admitting: Internal Medicine

## 2019-05-01 ENCOUNTER — Encounter: Payer: Self-pay | Admitting: Internal Medicine

## 2019-05-01 DIAGNOSIS — G43109 Migraine with aura, not intractable, without status migrainosus: Secondary | ICD-10-CM | POA: Insufficient documentation

## 2019-05-01 NOTE — Progress Notes (Signed)
Virtual Visit via Video Note  I connected with Victoria Dawson on 05/01/19 at  9:00 AM EDT by a video enabled telemedicine application and verified that I am speaking with the correct person using two identifiers.  The patient and the provider were at separate locations throughout the entire encounter.   I discussed the limitations of evaluation and management by telemedicine and the availability of in person appointments. The patient expressed understanding and agreed to proceed.  History of Present Illness: The patient is a 23 y.o. female with visit for ocular migraines. Started years ago with about 1 per year. She will get vision changes like kaleidoscope and then headache a bit later which lasts an hour or two. She is taking otc headache medicine which helps enough. She has had about 2-3 since March which was worrisome for her. Denies major changes including no changes to medications or otc supplements. Denies major changes in diet. Has no numbness or weakness during migraine. Denies weight change. Overall it is increasing some compared to past years. Has tried otc medications for these which help  Observations/Objective: Appearance: normal, breathing appears normal, casual grooming, abdomen does not appear distended, throat normal, memory normal, mental status is A and O times 3  Assessment and Plan: See problem oriented charting  Follow Up Instructions: monitor symptoms with headache journal. MRI not needed now but if increasing in frequency more let us know and we can consider this.  I discussed the assessment and treatment plan with the patient. The patient was provided an opportunity to ask questions and all were answered. The patient agreed with the plan and demonstrated an understanding of the instructions.   The patient was advised to call back or seek an in-person evaluation if the symptoms worsen or if the condition fails to improve as anticipated.  Hoyt Koch, MD

## 2019-05-01 NOTE — Assessment & Plan Note (Signed)
Headache journal discussed and most common triggers discussed as well. Indications for MRI discussed and she will get back in touch with [place] if unable to find trigger, or if she is having more often migraines or more severe.

## 2019-08-01 ENCOUNTER — Telehealth: Payer: Self-pay

## 2019-08-01 DIAGNOSIS — G4489 Other headache syndrome: Secondary | ICD-10-CM

## 2019-08-01 NOTE — Telephone Encounter (Signed)
Pt has been informed.

## 2019-08-01 NOTE — Telephone Encounter (Signed)
MRI placed

## 2019-08-01 NOTE — Telephone Encounter (Signed)
Copied from [place] [id number]. Topic: General - Inquiry >> Aug 01, 2019 12:38 PM Richardo Priest, NT wrote: Reason for CRM: Pt called in stating that her ocular migraines are coming back with some dizziness and eyesight not focusing. Pt would like to see if an order for a scan can be placed.

## 2019-08-01 NOTE — Addendum Note (Signed)
Addended by: Pricilla Holm A on: 08/01/2019 03:08 PM   Modules accepted: Orders

## 2019-08-08 ENCOUNTER — Encounter: Payer: Self-pay | Admitting: Internal Medicine

## 2019-08-15 ENCOUNTER — Ambulatory Visit
Admission: RE | Admit: 2019-08-15 | Discharge: 2019-08-15 | Disposition: A | Discharge status: Home / Self Care | Payer: Managed Care, Other (non HMO) | Source: Ambulatory Visit | Attending: Internal Medicine | Admitting: Internal Medicine

## 2019-08-15 ENCOUNTER — Other Ambulatory Visit: Payer: Self-pay

## 2019-08-15 DIAGNOSIS — G4489 Other headache syndrome: Secondary | ICD-10-CM

## 2019-08-22 ENCOUNTER — Ambulatory Visit (INDEPENDENT_AMBULATORY_CARE_PROVIDER_SITE_OTHER): Payer: Managed Care, Other (non HMO) | Admitting: Family

## 2019-08-22 ENCOUNTER — Encounter: Payer: Self-pay | Admitting: Family

## 2019-08-22 DIAGNOSIS — J069 Acute upper respiratory infection, unspecified: Secondary | ICD-10-CM

## 2019-08-22 NOTE — Progress Notes (Signed)
Victoria Dawson is a 24 y.o. female with the following history as recorded in [employer]:  Patient Active Problem List   Diagnosis Date Noted  . Ocular migraine 05/01/2019  . Flank pain 11/01/2018  . Gastroesophageal reflux disease with esophagitis 03/25/2017  . Routine general medical examination at a health care facility 11/05/2015  . 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 Anxiety 07/15/2012  . Depression 07/15/2012  . Renal cyst 06/23/2012    Current Outpatient Medications  Medication Sig Dispense Refill  . albuterol (VENTOLIN HFA) 108 (90 Base) MCG/ACT inhaler Inhale 2 puffs into the lungs every 6 (six) hours as needed for wheezing or shortness of breath. 1 Inhaler 0  . benzonatate (TESSALON PERLES) 100 MG capsule Take 1 capsule (100 mg total) by mouth 3 (three) times daily as needed. 20 capsule 0  . norethindrone (MICRONOR,CAMILA,ERRIN) 0.35 MG tablet      No current facility-administered medications for this visit.    Allergies: Patient has no known allergies.  Past Medical History:  Diagnosis Date  . Anxiety   . 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 Depression   . Fibrocystic breast changes   . Kidney cyst, acquired   . Kidney stone    Past 3-4 years ago  . Wears glasses     Past Surgical History:  Procedure Laterality Date  . TONSILLECTOMY      Family History  Problem Relation Age of Onset  . Thalassemia Father   . Throat cancer Father        Reported to be HPV positive  . Li-Fraumeni syndrome Paternal Aunt   . Ovarian cancer Paternal Aunt   . Leukemia Cousin        Paternal cousin  . Multiple myeloma Paternal Uncle   . Thalassemia Paternal Grandfather   . Dementia Paternal Grandfather     Social History   Tobacco Use  . Smoking status: Never Smoker  . Smokeless tobacco: Never Used  Substance Use Topics  . Alcohol use: No    Subjective:    I connected with Victoria Dawson on 08/22/19 at 11:40 AM EST by a video  enabled telemedicine application and verified that I am speaking with the correct person using two identifiers. Provider in office/ patient is at home; provider and patient are only 2 people on video call.     I discussed the limitations of evaluation and management by telemedicine and the availability of in person appointments. The patient expressed understanding and agreed to proceed.  2 day history of congestion/ sore throat; no fever, no shortness of breath; does not have any known exposure to COVID; wonders about getting a flu test or respiratory panel test done as well;    Objective:  There were no vitals filed for this visit.  General: Well developed, well nourished, in no acute distress  Skin : Warm and dry.  Head: Normocephalic and atraumatic  Lungs: Respirations unlabored;  Neurologic: Alert and oriented; speech intact; face symmetrical;   Assessment:  1. Viral URI with cough     Plan:  Discussed with patient that current recommendations are to treat for symptoms and test for COVID; symptomatic treatment discussed- okay to use OTC cough/ cold medications, warm salt water gargles for sore throat; patient is given information to self schedule for COVID testing; she understands to quarantine at home until her test results are back.    No follow-ups on file.  No orders of the  defined types were placed in this encounter.   Requested Prescriptions    No prescriptions requested or ordered in this encounter

## 2019-08-23 ENCOUNTER — Ambulatory Visit: Payer: Managed Care, Other (non HMO) | Attending: Internal Medicine

## 2019-08-23 DIAGNOSIS — Z20822 Contact with and (suspected) exposure to covid-19: Secondary | ICD-10-CM

## 2019-08-24 LAB — NOVEL CORONAVIRUS, NAA: SARS-CoV-2, NAA: NOT DETECTED

## 2021-04-12 ENCOUNTER — Emergency Department (HOSPITAL_BASED_OUTPATIENT_CLINIC_OR_DEPARTMENT_OTHER)
Admission: EM | Admit: 2021-04-12 | Discharge: 2021-04-12 | Disposition: A | Discharge status: Home / Self Care | Payer: 59 | Attending: Emergency Medicine | Admitting: Emergency Medicine

## 2021-04-12 ENCOUNTER — Other Ambulatory Visit: Payer: Self-pay

## 2021-04-12 ENCOUNTER — Encounter (HOSPITAL_BASED_OUTPATIENT_CLINIC_OR_DEPARTMENT_OTHER): Payer: Self-pay

## 2021-04-12 ENCOUNTER — Emergency Department (HOSPITAL_BASED_OUTPATIENT_CLINIC_OR_DEPARTMENT_OTHER): Payer: 59

## 2021-04-12 DIAGNOSIS — R109 Unspecified abdominal pain: Secondary | ICD-10-CM | POA: Diagnosis present

## 2021-04-12 DIAGNOSIS — M545 Low back pain, unspecified: Secondary | ICD-10-CM | POA: Diagnosis not present

## 2021-04-12 LAB — URINALYSIS, ROUTINE W REFLEX MICROSCOPIC
Bilirubin Urine: NEGATIVE
Glucose, UA: NEGATIVE mg/dL
Hgb urine dipstick: NEGATIVE
Ketones, ur: NEGATIVE mg/dL
Nitrite: NEGATIVE
Protein, ur: NEGATIVE mg/dL
Specific Gravity, Urine: 1.023 (ref 1.005–1.030)
pH: 7.5 (ref 5.0–8.0)

## 2021-04-12 LAB — COMPREHENSIVE METABOLIC PANEL
ALT: 13 U/L (ref 0–44)
AST: 17 U/L (ref 15–41)
Albumin: 4 g/dL (ref 3.5–5.0)
Alkaline Phosphatase: 36 U/L — ABNORMAL LOW (ref 38–126)
Anion gap: 8 (ref 5–15)
BUN: 11 mg/dL (ref 6–20)
CO2: 22 mmol/L (ref 22–32)
Calcium: 8 mg/dL — ABNORMAL LOW (ref 8.9–10.3)
Chloride: 112 mmol/L — ABNORMAL HIGH (ref 98–111)
Creatinine, Ser: 0.56 mg/dL (ref 0.44–1.00)
GFR, Estimated: >60 mL/min (ref >60)
Glucose, Bld: 83 mg/dL (ref 70–99)
Potassium: 3.4 mmol/L — ABNORMAL LOW (ref 3.5–5.1)
Sodium: 142 mmol/L (ref 135–145)
Total Bilirubin: 0.5 mg/dL (ref 0.3–1.2)
Total Protein: 6.1 g/dL — ABNORMAL LOW (ref 6.5–8.1)

## 2021-04-12 LAB — CBC WITH DIFFERENTIAL/PLATELET
Abs Immature Granulocytes: 0.05 10*3/uL (ref 0.00–0.07)
Basophils Absolute: 0.1 10*3/uL (ref 0.0–0.1)
Basophils Relative: 1 %
Eosinophils Absolute: 0.1 10*3/uL (ref 0.0–0.5)
Eosinophils Relative: 2 %
HCT: 35.2 % — ABNORMAL LOW (ref 36.0–46.0)
Hemoglobin: 10.6 g/dL — ABNORMAL LOW (ref 12.0–15.0)
Immature Granulocytes: 1 %
Lymphocytes Relative: 39 %
Lymphs Abs: 2.7 10*3/uL (ref 0.7–4.0)
MCH: 19.3 pg — ABNORMAL LOW (ref 26.0–34.0)
MCHC: 30.1 g/dL (ref 30.0–36.0)
MCV: 64.2 fL — ABNORMAL LOW (ref 80.0–100.0)
Monocytes Absolute: 0.5 10*3/uL (ref 0.1–1.0)
Monocytes Relative: 7 %
Neutro Abs: 3.6 10*3/uL (ref 1.7–7.7)
Neutrophils Relative %: 50 %
Platelets: 224 10*3/uL (ref 150–400)
RBC: 5.48 MIL/uL — ABNORMAL HIGH (ref 3.87–5.11)
RDW: 15.9 % — ABNORMAL HIGH (ref 11.5–15.5)
WBC: 7 10*3/uL (ref 4.0–10.5)
nRBC: 0 % (ref 0.0–0.2)

## 2021-04-12 LAB — PREGNANCY, URINE: Preg Test, Ur: NEGATIVE

## 2021-04-12 MED ORDER — MORPHINE SULFATE (PF) 4 MG/ML IV SOLN
4.0000 mg | Freq: Once | INTRAVENOUS | Status: DC
  Filled 2021-04-12: qty 1

## 2021-04-12 MED ORDER — ACETAMINOPHEN 500 MG PO TABS
1000.0000 mg | ORAL_TABLET | Freq: Once | ORAL | Status: AC
  Administered 2021-04-12: 1000 mg via ORAL
  Filled 2021-04-12: qty 2

## 2021-04-12 MED ORDER — ONDANSETRON HCL 4 MG/2ML IJ SOLN
4.0000 mg | Freq: Once | INTRAMUSCULAR | Status: DC
  Filled 2021-04-12: qty 2

## 2021-04-12 NOTE — Discharge Instructions (Signed)
Today your blood work was reassuring.  You are slightly anemic however this does appear to be consistent with your baseline looking at previous labs.  Your urine did not appear to be infected and did not have a large amount of blood in it. Your CT scan was very reassuring without any noted cause for your pain or other significant abnormalities.  If you develop fevers, worsening pain, begin vomiting and are unable to keep down liquids or keep yourself hydrated, or you have any other concerns please seek additional medical care and evaluation. Please follow-up with your primary care doctor.  Please take Ibuprofen (Advil, motrin) and Tylenol (acetaminophen) to relieve your pain.    You may take up to 600 MG (3 pills) of normal strength ibuprofen every 8 hours as needed.   You make take tylenol, up to 1,000 mg (two extra strength pills) every 8 hours as needed.   It is safe to take ibuprofen and tylenol at the same time as they work differently.   Do not take more than 3,000 mg tylenol in a 24 hour period (not more than one dose every 8 hours.  Please check all medication labels as many medications such as pain and cold medications may contain tylenol.  Do not drink alcohol while taking these medications.  Do not take other NSAID'S while taking ibuprofen (such as aleve or naproxen).  Please take ibuprofen with food to decrease stomach upset.

## 2021-04-12 NOTE — ED Triage Notes (Signed)
Pt reports R sided flank pain x"a few days"; pt has history of kidney stones and thinks that she currently has one; similar symptoms 2 weeks ago

## 2021-04-12 NOTE — ED Provider Notes (Signed)
[facility] Provider Note   CSN: [id number] Arrival date & time: 04/12/21  1716     History Chief Complaint  Patient presents with   Flank Pain    Victoria Dawson is a 25 y.o. female with a past medical history of kidney stone, last was in about 2012, beta thalassemia minor, anxiety, who presents today for evaluation of right-sided flank pain since yesterday.  She states that this feels very similar to when she had a kidney stone back in about 2012.  She states that the pain has been constant in the right sided flank.  Has not radiated or moved.  She denies any fevers.  No dysuria. She states she did have a similar however much more mild episode about 2 weeks ago.  She denies any anterior abdominal pain.  No vaginal discharge or concern for STI.  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     Family History  Problem Relation Age of Onset   Thalassemia Father    Throat cancer Father        Reported to be HPV positive   Li-Fraumeni syndrome Paternal Aunt    Ovarian cancer Paternal Aunt    Leukemia Cousin        Paternal cousin   Multiple myeloma Paternal Uncle    Thalassemia Paternal Grandfather    Dementia Paternal Grandfather     Social History   Tobacco  Use   Smoking status: Never   Smokeless tobacco: Never  Substance Use Topics   Alcohol use: No   Drug use: No    Home Medications Prior to Admission medications   Medication Sig Start Date End Date Taking? Authorizing Provider  norethindrone (MICRONOR,CAMILA,ERRIN) 0.35 MG tablet  10/13/18   [provider]  Norethindrone Acetate-Ethinyl Estrad-FE (LOESTRIN 24 FE) 1-20 MG-MCG(24) tablet Take 1 tablet by mouth daily.    [provider]    Allergies    Patient has no known allergies.  Review of Systems   Review of Systems  Constitutional:  Negative for chills and fever.  HENT:  Negative for congestion.   Respiratory:  Negative for chest tightness and shortness of breath.   Cardiovascular:  Negative for chest pain.  Gastrointestinal:  Positive for nausea. Negative for abdominal pain, constipation, diarrhea and vomiting.  Genitourinary:  Positive for flank pain. Negative for decreased urine volume, dysuria, frequency, hematuria and vaginal discharge.  Neurological:  Negative for weakness and headaches.  Psychiatric/Behavioral:  Negative for confusion.   All other systems reviewed and are negative.  Physical Exam Updated Vital Signs BP 122/69   Pulse 86   Temp 98 F (36.7 C) (Oral)  Resp 16   Ht 5' 1"  (1.549 m)   Wt 74.8 kg   LMP 03/30/2021 (Approximate) Comment: neg preg test  SpO2 98%   BMI 31.18 kg/m   Physical Exam Vitals and nursing note reviewed.  Constitutional:      General: She is not in acute distress.    Appearance: She is not diaphoretic.  HENT:     Head: Normocephalic and atraumatic.  Eyes:     General: No scleral icterus.       Right eye: No discharge.        Left eye: No discharge.     Conjunctiva/sclera: Conjunctivae normal.  Cardiovascular:     Rate and Rhythm: Normal rate and regular rhythm.  Pulmonary:     Effort: Pulmonary effort is normal. No respiratory distress.     Breath sounds: No stridor.  Abdominal:     General:  Abdomen is flat. There is no distension.     Tenderness: There is no abdominal tenderness. There is right CVA tenderness. There is no left CVA tenderness.  Musculoskeletal:        General: No deformity.     Cervical back: Normal range of motion.     Comments: There is right sided lateral lower back TTP.  TTP and tenderness to percussion cause equal pain.   Skin:    General: Skin is warm and dry.  Neurological:     Mental Status: She is alert.     Motor: No abnormal muscle tone.     Comments: Patient is awake and alert.  Speech is not slurred.  Answers questions appropriately without difficulty.  Psychiatric:        Behavior: Behavior normal.    ED Results / Procedures / Treatments   Labs (all labs ordered are listed, but only abnormal results are displayed) Labs Reviewed  COMPREHENSIVE METABOLIC PANEL - Abnormal; Notable for the following components:      Result Value   Potassium 3.4 (*)    Chloride 112 (*)    Calcium 8.0 (*)    Total Protein 6.1 (*)    Alkaline Phosphatase 36 (*)    All other components within normal limits  CBC WITH DIFFERENTIAL/PLATELET - Abnormal; Notable for the following components:   RBC 5.48 (*)    Hemoglobin 10.6 (*)    HCT 35.2 (*)    MCV 64.2 (*)    MCH 19.3 (*)    RDW 15.9 (*)    All other components within normal limits  URINALYSIS, ROUTINE W REFLEX MICROSCOPIC - Abnormal; Notable for the following components:   Leukocytes,Ua SMALL (*)    Bacteria, UA RARE (*)    All other components within normal limits  PREGNANCY, URINE    EKG None  Radiology CT Renal Stone Study  Result Date: 04/12/2021 CLINICAL DATA:  Pt reports R sided flank pain x"a few days"; pt has history of kidney stones EXAM: CT ABDOMEN AND PELVIS WITHOUT CONTRAST TECHNIQUE: Multidetector CT imaging of the abdomen and pelvis was performed following the standard protocol without IV contrast. COMPARISON:  None. FINDINGS: Lower chest: No acute abnormality. Evaluation of the  abdominal viscera limited by the lack of IV contrast. Hepatobiliary: No focal liver abnormality is seen. No gallstones, gallbladder wall thickening, or biliary dilatation. Pancreas: Unremarkable. No surrounding inflammatory changes. Spleen: Normal in size without focal abnormality. Adrenals/Urinary Tract: Adrenal glands are unremarkable. Kidneys are normal, without renal calculi or hydronephrosis. Bladder is unremarkable. Stomach/Bowel: Stomach is within normal limits. Appendix appears normal.  No evidence of bowel wall thickening, distention, or inflammatory changes. Vascular/Lymphatic: Vascular patency can not be assessed in the absence of IV contrast. No enlarged abdominal or pelvic lymph nodes. Reproductive: Uterus and bilateral adnexa are unremarkable. Other: No abdominal wall hernia or abnormality. No abdominopelvic ascites. Musculoskeletal: No acute or significant osseous findings. IMPRESSION: No acute intra-abdominal or intrapelvic pathology on a noncontrast exam. No evidence of renal calculi or hydronephrosis. Electronically Signed   By: Audie Pinto M.D.   On: 04/12/2021 19:35    Procedures Procedures   Medications Ordered in ED Medications  morphine 4 MG/ML injection 4 mg (0 mg Intravenous Hold 04/12/21 1845)  ondansetron (ZOFRAN) injection 4 mg (0 mg Intravenous Hold 04/12/21 1845)  acetaminophen (TYLENOL) tablet 1,000 mg (1,000 mg Oral Given 04/12/21 2014)    ED Course  I have reviewed the triage vital signs and the nursing notes.  Pertinent labs & imaging results that were available during my care of the patient were reviewed by me and considered in my medical decision making (see chart for details).    MDM Rules/Calculators/A&P                          Patient is a 25 year old woman who presents today for concern of kidney stone.  She had a remote history of stone almost 10 years ago and states this feels similar.  On exam she does have some right-sided CVA tenderness to both  percussion and palpation and is unable to differentiate if one hurts more than the other. CT renal study is obtained without hydronephrosis, hydroureter, evidence of renal stone or cause for patient's pain found.  BMP and CBC without evidence of acute abnormalities or cause for patient's pain.  She does have thalassemia and her anemia appears to be at baseline.  UA without evidence of infection with microscopy showing 0-5 reds, 0-5 whites, rare bacteria and 0-5 squamous epithelial cells.  As she is not having dysuria, frequency, or urgency doubt UTI or pyelonephritis. I ordered morphine and Zofran for patient for pain which she declined. Recommended outpatient PCP follow-up.  Conservative care, OTC meds for pain.    Return precautions were discussed with patient who states their understanding.  At the time of discharge patient denied any unaddressed complaints or concerns.  Patient is agreeable for discharge home.  Note: Portions of this report may have been transcribed using voice recognition software. Every effort was made to ensure accuracy; however, inadvertent computerized transcription errors may be present   Final Clinical Impression(s) / ED Diagnoses Final diagnoses:  Flank pain, acute    Rx / DC Orders ED Discharge Orders     None        Ollen Gross 04/12/21 2224    Charlesetta Shanks, MD 04/13/21 1626

## 2021-05-15 LAB — HM PAP SMEAR

## 2022-04-14 IMAGING — CT CT RENAL STONE PROTOCOL
3 of 4 series · 10 of 46 positions shown, 17 images · non-contrast
Comparison: None.

CLINICAL DATA: Pt reports R sided flank pain x"a few days"; pt has
history of kidney stones

EXAM:
CT ABDOMEN AND PELVIS WITHOUT CONTRAST
TECHNIQUE: Multidetector CT imaging of the abdomen and pelvis was performed
following the standard protocol without IV contrast.

[Series 4: lungs · axial · 0.72mm/px · z∈[+1239,+1339]mm · 6 of 30 slices shown, 11 images]
[im 5/30  soft-tissue]
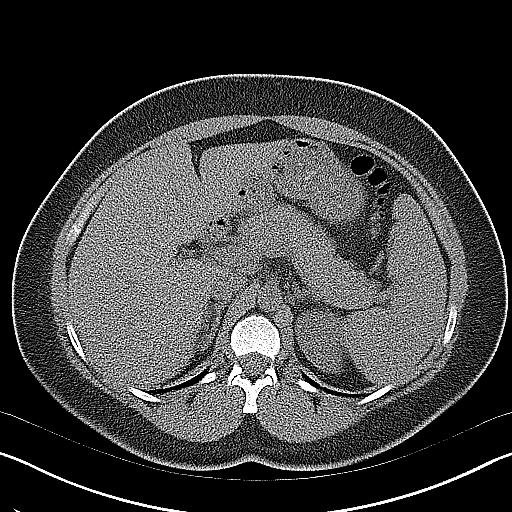
[im 5/30  bone]
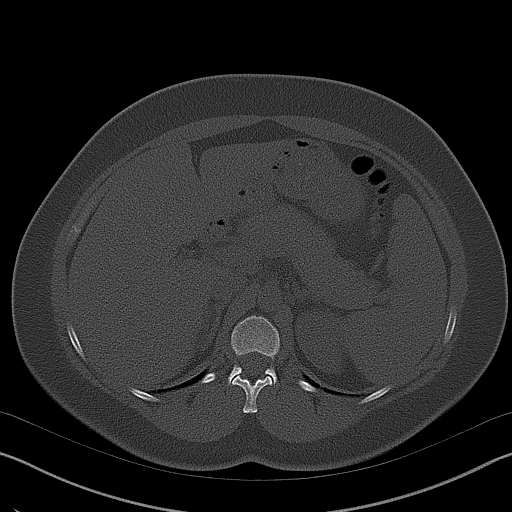
[im 9/30  soft-tissue]
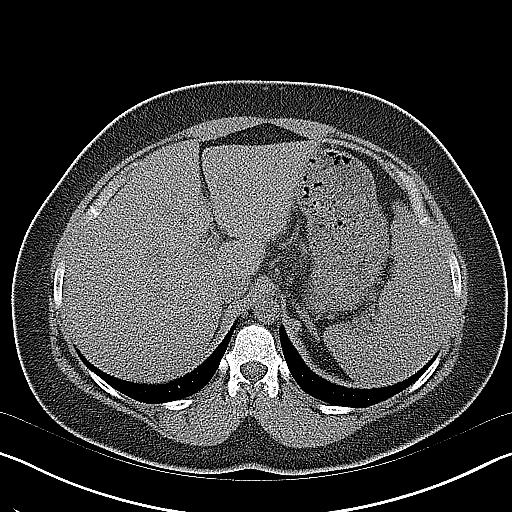
[im 13/30  soft-tissue]
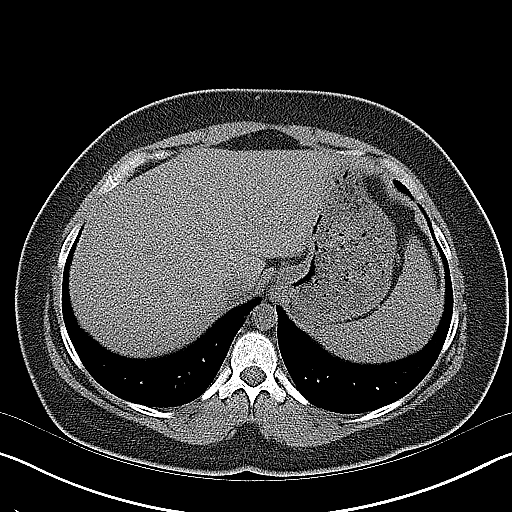
[im 13/30  lung]
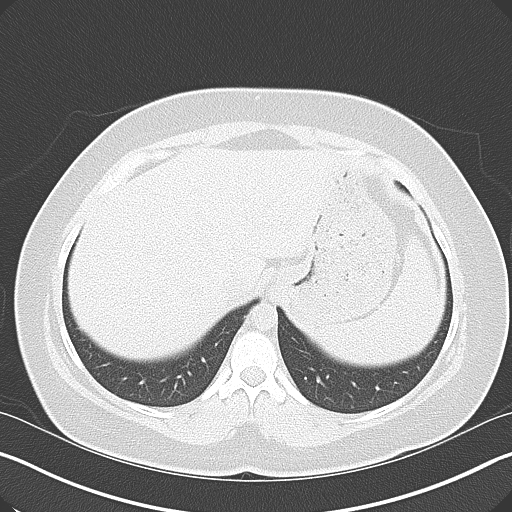
[im 17/30  soft-tissue]
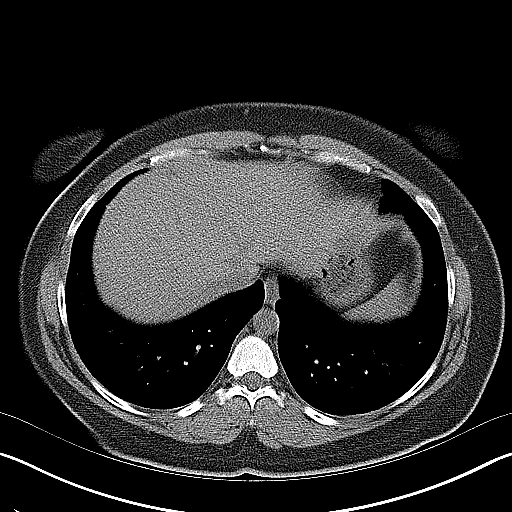
[im 17/30  lung]
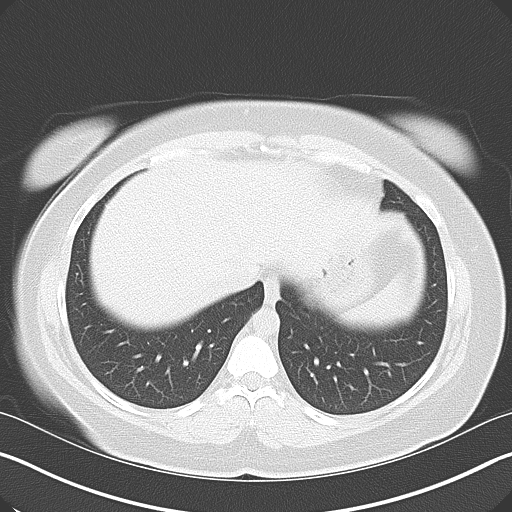
[im 21/30  soft-tissue]
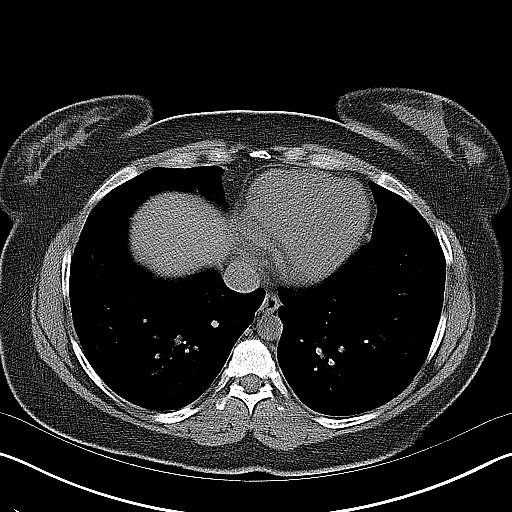
[im 21/30  lung]
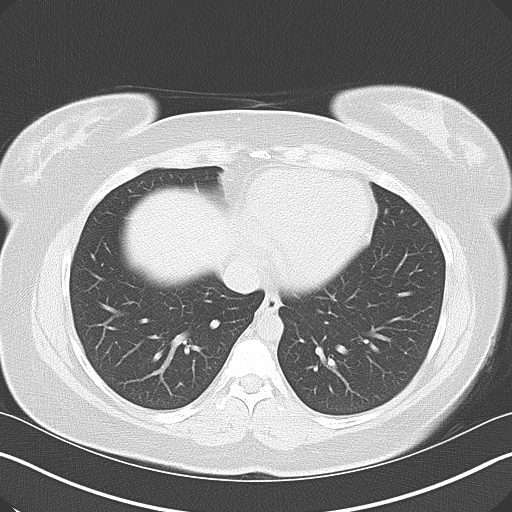
[im 25/30  soft-tissue]
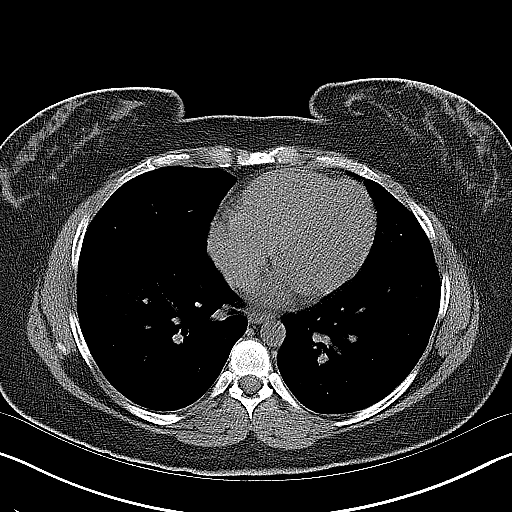
[im 25/30  lung]
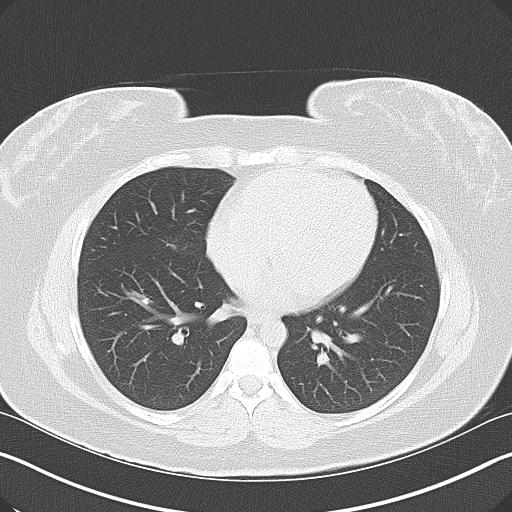

[Series 5: coronal · coronal · 0.71mm/px · 3 of 99 slices shown, 4 images]
[im 33/99  soft-tissue]
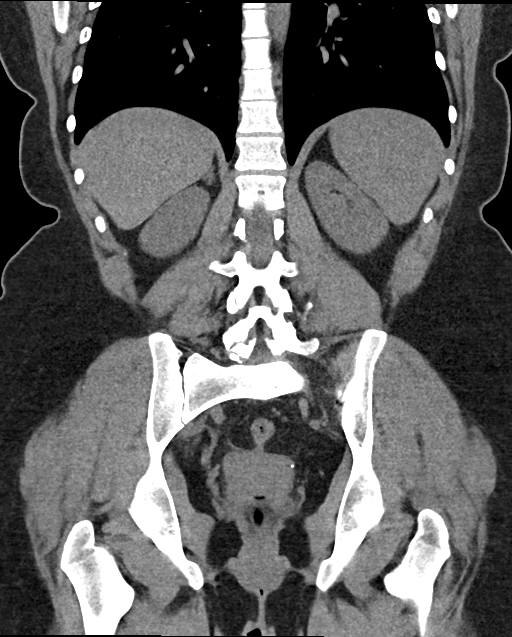
[im 44/99  soft-tissue]
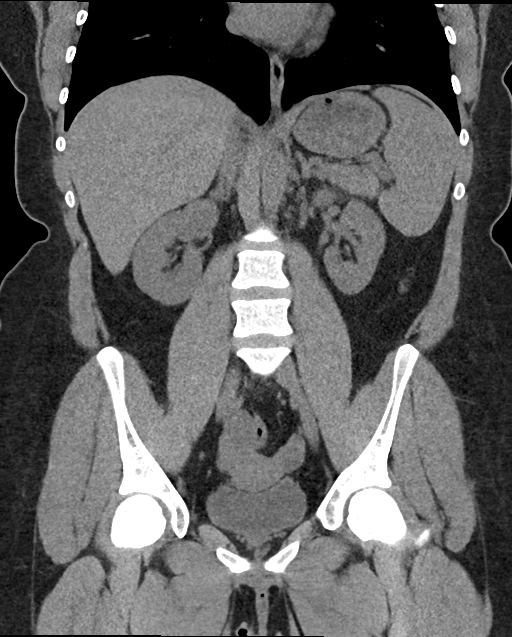
[im 44/99  bone]
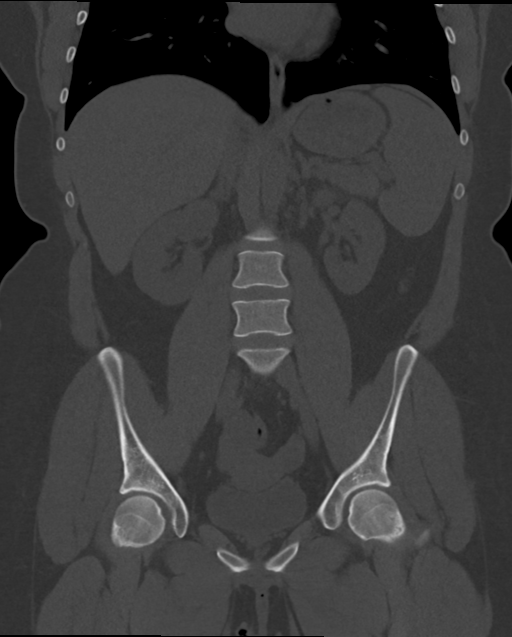
[im 55/99  soft-tissue]
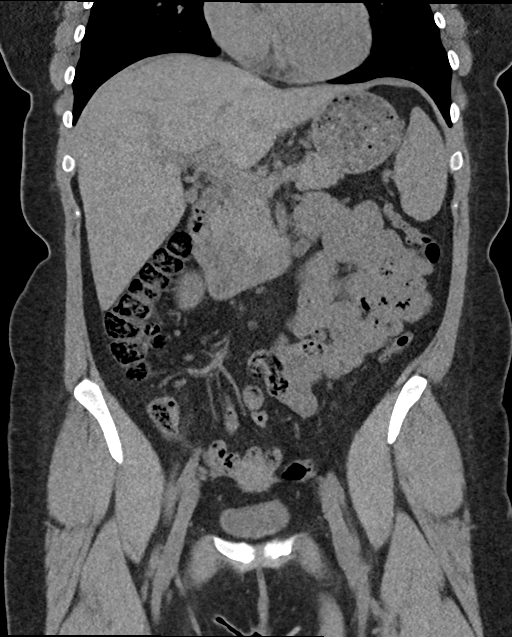

[Series 6: sagittal · sagittal · 0.58mm/px · 1 of 122 slices shown, 2 images]
[im 41/122  soft-tissue]
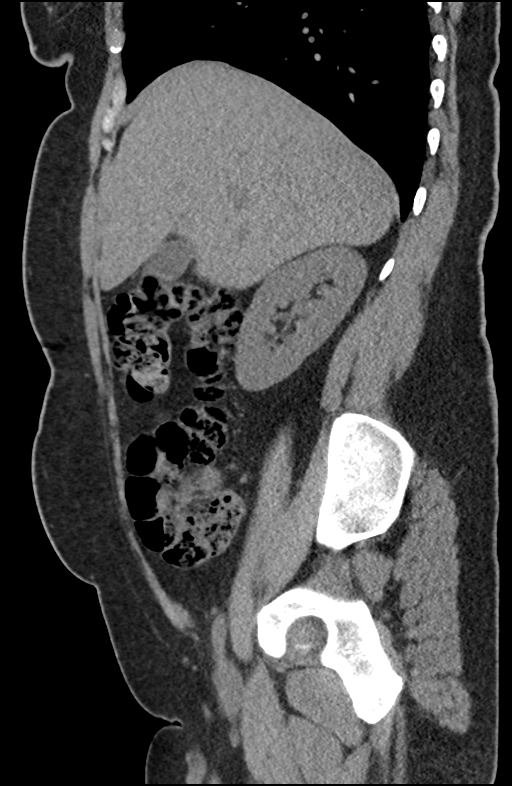
[im 41/122  bone]
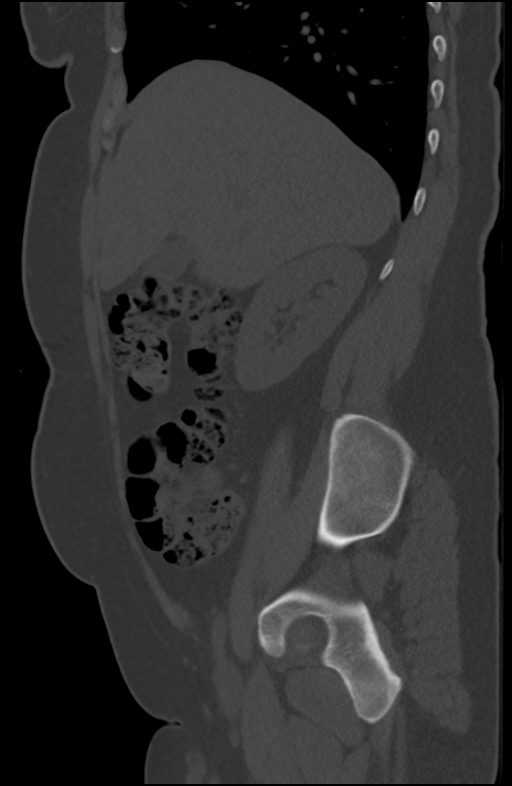

[10 of 46 positions shown; findings below may reference images not displayed]

FINDINGS: Lower chest: No acute abnormality.

Evaluation of the abdominal viscera limited by the lack of IV
contrast.

Hepatobiliary: No focal liver abnormality is seen. No gallstones,
gallbladder wall thickening, or biliary dilatation.

Pancreas: Unremarkable. No surrounding inflammatory changes.

Spleen: Normal in size without focal abnormality.

Adrenals/Urinary Tract: Adrenal glands are unremarkable. Kidneys are
normal, without renal calculi or hydronephrosis. Bladder is
unremarkable.

Stomach/Bowel: Stomach is within normal limits. Appendix appears
normal. No evidence of bowel wall thickening, distention, or
inflammatory changes.

Vascular/Lymphatic: Vascular patency can not be assessed in the
absence of IV contrast. No enlarged abdominal or pelvic lymph nodes.

Reproductive: Uterus and bilateral adnexa are unremarkable.

Other: No abdominal wall hernia or abnormality. No abdominopelvic
ascites.

Musculoskeletal: No acute or significant osseous findings.
IMPRESSION: No acute intra-abdominal or intrapelvic pathology on a noncontrast
exam. No evidence of renal calculi or hydronephrosis.

## 2023-10-17 ENCOUNTER — Other Ambulatory Visit: Payer: Self-pay

## 2023-10-17 ENCOUNTER — Encounter (HOSPITAL_BASED_OUTPATIENT_CLINIC_OR_DEPARTMENT_OTHER): Payer: Self-pay | Admitting: Emergency Medicine

## 2023-10-17 ENCOUNTER — Emergency Department (HOSPITAL_BASED_OUTPATIENT_CLINIC_OR_DEPARTMENT_OTHER)
Admission: EM | Admit: 2023-10-17 | Discharge: 2023-10-17 | Disposition: A | Discharge status: Home / Self Care | Attending: Emergency Medicine | Admitting: Emergency Medicine

## 2023-10-17 ENCOUNTER — Emergency Department (HOSPITAL_BASED_OUTPATIENT_CLINIC_OR_DEPARTMENT_OTHER)

## 2023-10-17 DIAGNOSIS — U071 COVID-19: Secondary | ICD-10-CM | POA: Insufficient documentation

## 2023-10-17 DIAGNOSIS — R509 Fever, unspecified: Secondary | ICD-10-CM | POA: Diagnosis present

## 2023-10-17 LAB — RESP PANEL BY RT-PCR (RSV, FLU A&B, COVID)  RVPGX2
Influenza A by PCR: NEGATIVE
Influenza B by PCR: NEGATIVE
Resp Syncytial Virus by PCR: NEGATIVE
SARS Coronavirus 2 by RT PCR: POSITIVE — AB

## 2023-10-17 LAB — BASIC METABOLIC PANEL
Anion gap: 8 (ref 5–15)
BUN: 7 mg/dL (ref 6–20)
CO2: 25 mmol/L (ref 22–32)
Calcium: 10 mg/dL (ref 8.9–10.3)
Chloride: 102 mmol/L (ref 98–111)
Creatinine, Ser: 0.76 mg/dL (ref 0.44–1.00)
GFR, Estimated: >60 mL/min (ref >60)
Glucose, Bld: 105 mg/dL — ABNORMAL HIGH (ref 70–99)
Potassium: 3.9 mmol/L (ref 3.5–5.1)
Sodium: 135 mmol/L (ref 135–145)

## 2023-10-17 LAB — CBC
HCT: 36.3 % (ref 36.0–46.0)
Hemoglobin: 11.3 g/dL — ABNORMAL LOW (ref 12.0–15.0)
MCH: 19.5 pg — ABNORMAL LOW (ref 26.0–34.0)
MCHC: 31.1 g/dL (ref 30.0–36.0)
MCV: 62.6 fL — ABNORMAL LOW (ref 80.0–100.0)
Platelets: 258 10*3/uL (ref 150–400)
RBC: 5.8 MIL/uL — ABNORMAL HIGH (ref 3.87–5.11)
RDW: 16.5 % — ABNORMAL HIGH (ref 11.5–15.5)
WBC: 9.3 10*3/uL (ref 4.0–10.5)
nRBC: 0.2 % (ref 0.0–0.2)

## 2023-10-17 LAB — URINALYSIS, ROUTINE W REFLEX MICROSCOPIC
Bilirubin Urine: NEGATIVE
Glucose, UA: NEGATIVE mg/dL
Hgb urine dipstick: NEGATIVE
Ketones, ur: NEGATIVE mg/dL
Leukocytes,Ua: NEGATIVE
Nitrite: NEGATIVE
Protein, ur: NEGATIVE mg/dL
Specific Gravity, Urine: 1.011 (ref 1.005–1.030)
pH: 6 (ref 5.0–8.0)

## 2023-10-17 LAB — GROUP A STREP BY PCR: Group A Strep by PCR: NOT DETECTED

## 2023-10-17 LAB — PREGNANCY, URINE: Preg Test, Ur: NEGATIVE

## 2023-10-17 MED ORDER — SODIUM CHLORIDE 0.9 % IV BOLUS
1000.0000 mL | Freq: Once | INTRAVENOUS | Status: AC
  Administered 2023-10-17: 1000 mL via INTRAVENOUS

## 2023-10-17 NOTE — Discharge Instructions (Addendum)
 Rest, increase hydration, increase vitamin C, Motrin or Tylenol for symptoms.  It is safe to take 600 mg of Motrin every 8 hours as needed for body aches, pain, headache, fevers.  It is also safe to take Tylenol 650 mg every 8 hours for the symptoms.

## 2023-10-17 NOTE — ED Triage Notes (Signed)
 Seen a few days ago for flu like symptoms. Woke up today with worsening symptoms and sore throat. Also starting today urinary symptoms- polyuria, dysuria with flank pain.

## 2023-10-17 NOTE — ED Provider Notes (Signed)
 [place] EMERGENCY DEPARTMENT AT [facility] Provider Note   CSN: [id number] Arrival date & time: 10/17/23  1920     History  Chief Complaint  Patient presents with   Flank Pain   Fever    Victoria Dawson is a 28 y.o. female.  Patient is a 28 year old female presenting for flulike syndrome that started several days ago.  Patient has a sore throat, fevers, concerns for urinary frequency.  No dysuria or urgency.  Patient denies shortness of breath or coughing.  Patient denies chest pain.  The history is provided by the patient. No language interpreter was used.  Flank Pain Pertinent negatives include no chest pain, no abdominal pain and no shortness of breath.  Fever Associated symptoms: sore throat   Associated symptoms: no chest pain, no chills, no cough, no dysuria, no ear pain, no rash and no vomiting        Home Medications Prior to Admission medications   Medication Sig Start Date End Date Taking? Authorizing Provider  norethindrone (MICRONOR,CAMILA,ERRIN) 0.35 MG tablet  10/13/18   [provider]  Norethindrone Acetate-Ethinyl Estrad-FE (LOESTRIN 24 FE) 1-20 MG-MCG(24) tablet Take 1 tablet by mouth daily.    [provider]      Allergies    Patient has no known allergies.    Review of Systems   Review of Systems  Constitutional:  Positive for fever. Negative for chills.  HENT:  Positive for sore throat. Negative for ear pain.   Eyes:  Negative for pain and visual disturbance.  Respiratory:  Negative for cough and shortness of breath.   Cardiovascular:  Negative for chest pain and palpitations.  Gastrointestinal:  Negative for abdominal pain and vomiting.  Genitourinary:  Positive for frequency. Negative for dysuria, hematuria and urgency.  Musculoskeletal:  Negative for arthralgias and back pain.  Skin:  Negative for color change and rash.  Neurological:  Negative for seizures and syncope.  All other systems reviewed and are  negative.   Physical Exam Updated Vital Signs BP (!) 138/99   Pulse (!) 110   Temp 98.6 F (37 C) (Oral)   Resp 18   SpO2 97%  Physical Exam Vitals and nursing note reviewed.  Constitutional:      General: She is not in acute distress.    Appearance: She is well-developed.  HENT:     Head: Normocephalic and atraumatic.  Eyes:     Conjunctiva/sclera: Conjunctivae normal.  Cardiovascular:     Rate and Rhythm: Normal rate and regular rhythm.     Heart sounds: No murmur heard. Pulmonary:     Effort: Pulmonary effort is normal. No respiratory distress.     Breath sounds: Normal breath sounds.  Abdominal:     Palpations: Abdomen is soft.     Tenderness: There is no abdominal tenderness.  Musculoskeletal:        General: No swelling.     Cervical back: Neck supple.  Skin:    General: Skin is warm and dry.     Capillary Refill: Capillary refill takes less than 2 seconds.  Neurological:     Mental Status: She is alert.  Psychiatric:        Mood and Affect: Mood normal.     ED Results / Procedures / Treatments   Labs (all labs ordered are listed, but only abnormal results are displayed) Labs Reviewed  RESP PANEL BY RT-PCR (RSV, FLU A&B, COVID)  RVPGX2 - Abnormal; Notable for the following components:  Result Value   SARS Coronavirus 2 by RT PCR POSITIVE (*)    All other components within normal limits  BASIC METABOLIC PANEL - Abnormal; Notable for the following components:   Glucose, Bld 105 (*)    All other components within normal limits  CBC - Abnormal; Notable for the following components:   RBC 5.80 (*)    Hemoglobin 11.3 (*)    MCV 62.6 (*)    MCH 19.5 (*)    RDW 16.5 (*)    All other components within normal limits  GROUP A STREP BY PCR  URINALYSIS, ROUTINE W REFLEX MICROSCOPIC  PREGNANCY, URINE    EKG None  Radiology DG Chest Portable 1 View Result Date: 10/17/2023 CLINICAL DATA:  Shortness of breath. EXAM: PORTABLE CHEST 1 VIEW COMPARISON:   07/27/2016 FINDINGS: The cardiomediastinal contours are normal. The lungs are clear. Pulmonary vasculature is normal. No consolidation, pleural effusion, or pneumothorax. No acute osseous abnormalities are seen. IMPRESSION: No active disease. Electronically Signed   By: Narda Rutherford M.D.   On: 10/17/2023 20:59    Procedures Procedures    Medications Ordered in ED Medications  sodium chloride 0.9 % bolus 1,000 mL (0 mLs Intravenous Stopped 10/17/23 2217)    ED Course/ Medical Decision Making/ A&P                                 Medical Decision Making Amount and/or Complexity of Data Reviewed Labs: ordered. Radiology: ordered.   28 year old female presenting for flulike symptoms and possibly increased urinary frequency without dysuria or urgency.  Physical exam unimpressive.  Positive for COVID virus.  UA demonstrates no UTI.  No hematuria.  No flank pain on exam.  Negative Lloyd sign.  Stable renal function.  Stable chest x-ray.  Patient recommended for increase hydration, rest, vitamin C, Motrin and Tylenol for symptomatic management of fevers or headaches.  Patient in no distress and overall condition improved here in the ED. Detailed discussions were had with the patient regarding current findings, and need for close f/u with PCP or on call doctor. The patient has been instructed to return immediately if the symptoms worsen in any way for re-evaluation. Patient verbalized understanding and is in agreement with current care plan. All questions answered prior to discharge.         Final Clinical Impression(s) / ED Diagnoses Final diagnoses:  COVID    Rx / DC Orders ED Discharge Orders     None         Franne Forts, DO 10/17/23 2232

## 2023-11-10 ENCOUNTER — Encounter: Payer: Self-pay | Admitting: Family

## 2023-11-10 ENCOUNTER — Ambulatory Visit (INDEPENDENT_AMBULATORY_CARE_PROVIDER_SITE_OTHER): Admitting: Family

## 2023-11-10 VITALS — BP 125/84 | HR 74 | Temp 98.0°F | Ht 61.0 in | Wt 176.0 lb

## 2023-11-10 DIAGNOSIS — D500 Iron deficiency anemia secondary to blood loss (chronic): Secondary | ICD-10-CM | POA: Diagnosis not present

## 2023-11-10 DIAGNOSIS — E559 Vitamin D deficiency, unspecified: Secondary | ICD-10-CM | POA: Insufficient documentation

## 2023-11-10 DIAGNOSIS — F419 Anxiety disorder, unspecified: Secondary | ICD-10-CM

## 2023-11-10 DIAGNOSIS — F32A Depression, unspecified: Secondary | ICD-10-CM | POA: Diagnosis not present

## 2023-11-10 DIAGNOSIS — D563 Thalassemia minor: Secondary | ICD-10-CM | POA: Diagnosis not present

## 2023-11-10 NOTE — Patient Instructions (Addendum)
 Welcome to [facility], It was a pleasure meeting you today!   I will review your lab results via MyChart in a few days.  I have sent a referral to our psychiatric clinic. But you have to call their office to schedule your first appointment. Call [facility] at [phone].       PLEASE NOTE: If you had any LAB tests please let us know if you have not heard back within a few days. You may see your results on MyChart before we have a chance to review them but we will give you a call once they are reviewed by [place]. If we ordered any REFERRALS today, please let us know if you have not heard from their office within the next week.  Let us know through MyChart if you are needing REFILLS, or have your pharmacy send [place] the request. You can also use MyChart to communicate with me or any office staff.  Please try these tips to maintain a healthy lifestyle: It is important that you exercise regularly at least 30 minutes 5 times a week. Think about what you will eat, plan ahead. Choose whole foods, & think  "clean, green, fresh or frozen" over canned, processed or packaged foods which are more sugary, salty, and fatty. 70 to 75% of food eaten should be fresh vegetables and protein. 2-3  meals daily with healthy snacks between meals, but must be whole fruit, protein or vegetables. Aim to eat over a 10 hour period when you are active, for example, 7am to 5pm, and then STOP after your last meal of the day, drinking only water.  Shorter eating windows, 6-8 hours, are showing benefits in heart disease and blood sugar regulation. Drink water every day! Shoot for 64 ounces daily = 8 cups, no other drink is as healthy! Fruit juice is best enjoyed in a healthy way, by EATING the fruit.

## 2023-11-10 NOTE — Progress Notes (Signed)
 "  New Patient Office Visit  Subjective:  Patient ID: Victoria Dawson, female    DOB: 1995/09/07  Age: 28 y.o. MRN: [id number]  CC:  Chief Complaint  Patient presents with   New Patient (Initial Visit)   Anemia   Depression    Pt c/o depression, would like to discuss.    HPI Victoria Dawson presents for establishing care today.  Discussed the use of AI scribe software for clinical note transcription with the patient, who gave verbal consent to proceed.  History of Present Illness The patient, with a known history of thalassemia and anemia, presents for establishing care. She reports experiencing fatigue, which she attributes to her anemia. She has been taking a prenatal vitamin to address deficiencies in vitamin D , folic acid , and iron, but admits to not taking it consistently. She also reports experiencing gastrointestinal symptoms, including stomach cramps, diarrhea, and frequent urgency, which have been severe enough to disrupt her work. She expresses concern about a potential parasite infection from her dog, but this is unconfirmed. In addition to these physical health concerns, the patient is also dealing with mental health issues. She has been diagnosed with depression, anxiety, ADHD, and OCD by her therapist, and is considering medication as her symptoms have been difficult to control and are now physically affecting her. She has previously been on anxiety medication, including Wellbutrin and sertraline, but discontinued due to concerns about increasing doses.  Assessment & Plan Iron deficiency anemia - Iron deficiency anemia likely secondary to menorrhagia. Recommend slow-release iron supplementation as a safe option if currently anemic. - Ok to continue prenatal vitamin for vitamin D , folic acid , and iron for now. - Check CBC w/diff, iron panel, and Folate today. - F/U prn  Depression, anxiety, ADHD, and OCD - Reports difficult-to-manage symptoms. Previously tried  Wellbutrin and sertraline but discontinued. Currently seeing a therapist and considering psychiatric evaluation for medication management. Discussed referral process and need to initiate contact with psychiatry. - Refer to psychiatry for evaluation and potential medication management. - Provide psychiatry contact information and instruct to call for an appointment. - Advise follow-up if no contact from psychiatry within two weeks.  Gastrointestinal symptoms - Reports abdominal cramps, diarrhea, and urgency. Symptoms may be related to anxiety or IBS. Discussed stress and catecholamines' role in exacerbating symptoms. - Consider trial of a probiotic to restore gut microbiome balance. - Monitor symptoms and follow up if persistent or worsening.  Vitamin D  def - Discussed vitamin D 's importance for immunity, bone health, mood, and fatigue. Currently taking prenatal vitamins but may not be getting sufficient vitamin D . - Evaluate vitamin D  levels and consider supplementation if needed.   Subjective:    Outpatient Medications Prior to Visit  Medication Sig Dispense Refill   OVER THE COUNTER MEDICATION daily. NATURE MADE PRENATAL     OVER THE COUNTER MEDICATION NATURE MADE VITAMIN C     norethindrone (MICRONOR,CAMILA,ERRIN) 0.35 MG tablet  (Patient not taking: Reported on 11/10/2023)     Norethindrone Acetate-Ethinyl Estrad-FE (LOESTRIN 24 FE) 1-20 MG-MCG(24) tablet Take 1 tablet by mouth daily. (Patient not taking: Reported on 11/10/2023)     No facility-administered medications prior to visit.   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  Constitutional:      Appearance: Normal appearance.  Cardiovascular:     Rate and Rhythm: Normal rate and regular rhythm.  Pulmonary:     Effort: Pulmonary effort is normal.     Breath sounds: Normal breath sounds.  Musculoskeletal:        General: Normal range of motion.  Skin:    General: Skin is warm and dry.  Neurological:     Mental Status: She is alert.  Psychiatric:        Mood and Affect: Mood normal.        Behavior: Behavior normal.     Lucius Krabbe, NP "

## 2023-11-11 NOTE — Assessment & Plan Note (Signed)
 Iron deficiency anemia likely secondary to menorrhagia. Recommend slow-release iron supplementation as a safe option if currently anemic. - Ok to continue prenatal vitamin for vitamin D, folic acid, and iron for now. - Check CBC w/diff, iron panel, and Folate today.

## 2023-11-11 NOTE — Assessment & Plan Note (Signed)
 Reports difficult-to-manage symptoms. Previously tried Wellbutrin and sertraline but discontinued. Currently seeing a therapist and considering psychiatric evaluation for medication management. Discussed referral process and need to initiate contact with psychiatry. - Refer to psychiatry for evaluation and potential medication management. - Provide psychiatry contact information and instruct to call for an appointment. - Advise follow-up if no contact from psychiatry within two weeks.

## 2023-11-11 NOTE — Assessment & Plan Note (Signed)
 Discussed vitamin D's importance for immunity, bone health, mood, and fatigue. Currently taking prenatal vitamins but may not be getting sufficient vitamin D. - Evaluate vitamin D levels and consider supplementation if needed.

## 2023-11-24 ENCOUNTER — Encounter: Admitting: Family

## 2023-11-29 ENCOUNTER — Telehealth: Payer: Self-pay | Admitting: Family

## 2023-11-29 NOTE — Telephone Encounter (Signed)
 Copied from CRM [id number]. Topic: Referral - Request for Referral >> Nov 26, 2023  1:19 PM Antwanette L wrote: Did the patient discuss referral with their provider in the last year? Yes   Appointment offered? No  Type of order/referral and detailed reason for visit: Orthopedic  Preference of office, provider, location: N/A  If referral order, have you been seen by this specialty before? No   Can we respond through MyChart? No. Contact pt by phone at [phone]

## 2023-11-30 NOTE — Telephone Encounter (Signed)
 I called pt and lvm in regards to referral. Pt will need an OV for this, she's scheduled to come in on 4/24. This can be discussed at visit.

## 2023-12-02 ENCOUNTER — Encounter: Admitting: Family

## 2023-12-02 ENCOUNTER — Ambulatory Visit (INDEPENDENT_AMBULATORY_CARE_PROVIDER_SITE_OTHER): Admitting: Family

## 2023-12-02 ENCOUNTER — Encounter: Payer: Self-pay | Admitting: Family

## 2023-12-02 VITALS — BP 126/83 | HR 90 | Temp 97.7°F | Ht 61.0 in | Wt 174.2 lb

## 2023-12-02 DIAGNOSIS — Z0000 Encounter for general adult medical examination without abnormal findings: Secondary | ICD-10-CM | POA: Diagnosis not present

## 2023-12-02 DIAGNOSIS — Z13220 Encounter for screening for lipoid disorders: Secondary | ICD-10-CM

## 2023-12-02 DIAGNOSIS — R7989 Other specified abnormal findings of blood chemistry: Secondary | ICD-10-CM | POA: Diagnosis not present

## 2023-12-02 DIAGNOSIS — Z0001 Encounter for general adult medical examination with abnormal findings: Secondary | ICD-10-CM

## 2023-12-02 DIAGNOSIS — R109 Unspecified abdominal pain: Secondary | ICD-10-CM

## 2023-12-02 LAB — CBC WITH DIFFERENTIAL/PLATELET
Basophils Absolute: 0 10*3/uL (ref 0.0–0.1)
Basophils Relative: 0.6 % (ref 0.0–3.0)
Eosinophils Absolute: 0.1 10*3/uL (ref 0.0–0.7)
Eosinophils Relative: 1.9 % (ref 0.0–5.0)
HCT: 33.6 % — ABNORMAL LOW (ref 36.0–46.0)
Hemoglobin: 10.6 g/dL — ABNORMAL LOW (ref 12.0–15.0)
Lymphocytes Relative: 28.7 % (ref 12.0–46.0)
Lymphs Abs: 1.8 10*3/uL (ref 0.7–4.0)
MCHC: 31.7 g/dL (ref 30.0–36.0)
MCV: 62.1 fl — ABNORMAL LOW (ref 78.0–100.0)
Monocytes Absolute: 0.3 10*3/uL (ref 0.1–1.0)
Monocytes Relative: 5.4 % (ref 3.0–12.0)
Neutro Abs: 4 10*3/uL (ref 1.4–7.7)
Neutrophils Relative %: 63.4 % (ref 43.0–77.0)
Platelets: 249 10*3/uL (ref 150.0–400.0)
RBC: 5.4 Mil/uL — ABNORMAL HIGH (ref 3.87–5.11)
RDW: 15.3 % (ref 11.5–15.5)
WBC: 6.3 10*3/uL (ref 4.0–10.5)

## 2023-12-02 LAB — COMPREHENSIVE METABOLIC PANEL WITH GFR
ALT: 20 U/L (ref 0–35)
AST: 18 U/L (ref 0–37)
Albumin: 4.5 g/dL (ref 3.5–5.2)
Alkaline Phosphatase: 45 U/L (ref 39–117)
BUN: 14 mg/dL (ref 6–23)
CO2: 26 meq/L (ref 19–32)
Calcium: 9.5 mg/dL (ref 8.4–10.5)
Chloride: 105 meq/L (ref 96–112)
Creatinine, Ser: 0.67 mg/dL (ref 0.40–1.20)
GFR: 118.71 mL/min (ref >60.00)
Glucose, Bld: 94 mg/dL (ref 70–99)
Potassium: 4.1 meq/L (ref 3.5–5.1)
Sodium: 138 meq/L (ref 135–145)
Total Bilirubin: 0.9 mg/dL (ref 0.2–1.2)
Total Protein: 7.1 g/dL (ref 6.0–8.3)

## 2023-12-02 LAB — LIPID PANEL
Cholesterol: 184 mg/dL (ref 0–200)
HDL: 42.3 mg/dL (ref >39.00)
LDL Cholesterol: 114 mg/dL — ABNORMAL HIGH (ref 0–99)
NonHDL: 141.46
Total CHOL/HDL Ratio: 4
Triglycerides: 138 mg/dL (ref 0.0–149.0)
VLDL: 27.6 mg/dL (ref 0.0–40.0)

## 2023-12-02 LAB — TSH: TSH: 0.01 u[IU]/mL — ABNORMAL LOW (ref 0.35–5.50)

## 2023-12-02 NOTE — Patient Instructions (Addendum)
 It was very nice to see you today!   I will review your lab results via MyChart in a few days. As we talked about, look for an over the counter Probiotic for diarrhea, looking for organic at a health food store if affordable. Try avoiding the foods on the FODMAP list also.  Schedule your therapy appointment!   Enjoy the Spring weather!     PLEASE NOTE:  If you had any lab tests please let us  know if you have not heard back within a few days. You may see your results on MyChart before we have a chance to review them but we will give you a call once they are reviewed by us . If we ordered any referrals today, please let us  know if you have not heard from their office within the next week.

## 2023-12-02 NOTE — Progress Notes (Signed)
 Phone [phone]  Subjective:   Patient is a 28 y.o. female presenting for annual physical.    Chief Complaint  Patient presents with   Annual Exam    Fasting w/ labs   Discussed the use of AI scribe software for clinical note transcription with the patient, who gave verbal consent to proceed.  History of Present Illness The patient, with a history of persistent GI symptoms, presents for a physical with fasting labs. She reports a daily pattern of cramping and diarrhea, often waking up with an urgent need to defecate. The stool consistency varies from loose to watery, with occasional normal stools. The patient denies any significant bloating, and the symptoms do not seem to be consistently associated with meals or specific foods. She has noticed some correlation with certain gluten-containing foods like bagels, but not consistently with all gluten products. The patient also reports a new skin rash, described as bumpy and non-irritating, localized to one arm. She has a family history of food allergies and Hashimoto's disease, raising concerns about potential hereditary conditions.  See problem oriented charting- ROS- full  review of systems was completed and negative except for what is noted in HPI above.  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/01/2019   Gastroesophageal reflux disease with esophagitis 03/25/2017   Anemia, hemolytic, 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   Anxiety  07/15/2012   Anxiety and depression 07/15/2012   Renal cyst 06/23/2012   Past Surgical History:  Procedure Laterality Date   TONSILLECTOMY      Family History  Problem Relation Age of Onset   Thalassemia Father    Throat cancer Father        Reported to be HPV positive   Li-Fraumeni syndrome Paternal Aunt    Ovarian cancer Paternal Aunt    Leukemia Cousin        Paternal cousin   Multiple myeloma Paternal Uncle    Thalassemia Paternal Grandfather    Dementia Paternal Grandfather     Medications- reviewed and updated Current Outpatient Medications  Medication Sig Dispense Refill   OVER THE COUNTER MEDICATION daily. NATURE MADE PRENATAL     OVER THE COUNTER MEDICATION NATURE MADE VITAMIN C     No current facility-administered medications for this visit.    Allergies-reviewed and updated No Known Allergies  Social History   Social History Narrative   Not on file    Objective:  BP 126/83 (BP Location: Left Arm, Patient Position: Sitting, Cuff Size: Large)   Pulse 90   Temp 97.7 F (36.5 C) (Temporal)   Ht 5\' 1"  (1.549 m)   Wt 174 lb 3.2 oz (79 kg)   LMP 11/30/2023 (Exact Date)   SpO2 99%   BMI 32.91 kg/m  Physical Exam Vitals and nursing note reviewed.  Constitutional:      Appearance: Normal appearance.  HENT:     Head: Normocephalic.  Right Ear: Tympanic membrane normal.     Left Ear: Tympanic membrane normal.     Nose: Nose normal.     Mouth/Throat:     Mouth: Mucous membranes are moist.  Eyes:     Pupils: Pupils are equal, round, and reactive to light.  Cardiovascular:     Rate and Rhythm: Normal rate and regular rhythm.  Pulmonary:     Effort: Pulmonary effort is normal.     Breath sounds: Normal breath sounds.  Musculoskeletal:        General: Normal range of motion.     Cervical back: Normal range of motion.  Lymphadenopathy:     Cervical: No cervical adenopathy.  Skin:    General: Skin is warm and dry.     Findings: Rash (cluster of  pinpoint clear bumps on anterior distal forearm approx 4 cm in length) present.       Neurological:     Mental Status: She is alert.  Psychiatric:        Mood and Affect: Mood normal.        Behavior: Behavior normal.      Assessment and Plan   Health Maintenance counseling: 1. Anticipatory guidance: Patient counseled regarding regular dental exams q6 months, eye exams,  avoiding smoking and second hand smoke, limiting alcohol to 1 beverage per day, no illicit drugs.   2. Risk factor reduction:  Advised patient of need for regular exercise and diet rich with fruits and vegetables to reduce risk of heart attack and stroke. Wt Readings from Last 3 Encounters:  12/02/23 174 lb 3.2 oz (79 kg)  11/10/23 176 lb (79.8 kg)  04/12/21 165 lb (74.8 kg)   3. Immunizations/screenings/ancillary studies Immunization History  Administered Date(s) Administered   DTaP 05/11/1995, 07/11/1995, 09/11/1995, 08/10/1996, 03/11/1999   Hepatitis A 01/30/2010, 09/01/2011   Hepatitis A, Ped/Adol-2 Dose 01/30/2010, 09/01/2011   Hepatitis B 06/12/1995, 05/11/1995, 12/09/1995   IPV 05/11/1995, 07/11/1995, 09/11/1995, 08/10/1996   MMR 07/10/1996, 01/09/2000   Meningococcal Conjugate 01/30/2010, 09/01/2011   Td 01/30/2010   Td (Adult),5 Lf Tetanus Toxid, Preservative Free 01/30/2010   Tdap 01/30/2010, 03/10/2013, 08/05/2014   Varicella 07/10/1996, 01/30/2010   There are no preventive care reminders to display for this patient.  4. Cervical cancer screening: last PAP done 2022 5. Skin cancer screening- advised regular sunscreen use. Denies worrisome, changing, or new skin lesions.  6. Birth control/STD check: none/NA 7. Smoking associated screening: never smoker 8. Alcohol screening: socially  Assessment & Plan Diarrhea and cramping Persistent diarrhea and cramping for one month, likely IBS. Discussed dietary triggers, stress, food allergies, and family history of Hashimoto's disease. - Provide FODMAP  diet information. - Recommend OTC probiotic for diarrhea, organic or look for in health food store. - Order food allergy panel. - Encourage follow-up with therapy for stress management. - F/U prn  Wellness Visit Routine wellness visit with normal blood pressure and general health parameters. Discussed exercise, sunscreen, and dental hygiene. - Schedule Pap smear with gynecologist. - Encourage regular cardio exercise for 15-20 minutes daily getting HR above 120 beats per minute for a sustained time. - Advise use of sunscreen for skin protection daily. - Recommend biodegradable floss for dental hygiene.  Recommended follow up:  Return for any future concerns, Complete physical w/fasting labs. No future appointments.  Lab/Order associations:  fasting    Versa Gore, NP

## 2023-12-08 ENCOUNTER — Encounter: Payer: Self-pay | Admitting: Family

## 2023-12-08 LAB — FOOD ALLERGY PROFILE
Allergen, Salmon, f41: <0.1 kU/L
Almonds: <0.1 kU/L
Brazil Nut: <0.1 kU/L
CLASS: 0
CLASS: 0
CLASS: 0
CLASS: 0
CLASS: 0
CLASS: 0
CLASS: 0
CLASS: 0
CLASS: 0
CLASS: 0
CLASS: 0
Cashew IgE: <0.1 kU/L
Class: 0
Class: 0
Class: 2
Egg White IgE: 0.16 kU/L — ABNORMAL HIGH
Fish Cod: <0.1 kU/L
Hazelnut: <0.1 kU/L
Macadamia Nut: <0.1 kU/L
Milk IgE: 0.13 kU/L — ABNORMAL HIGH
Peanut IgE: <0.1 kU/L
Scallop IgE: 0.16 kU/L — ABNORMAL HIGH
Sesame Seed f10: <0.1 kU/L
Shrimp IgE: 0.84 kU/L — ABNORMAL HIGH
Soybean IgE: <0.1 kU/L
Tuna IgE: <0.1 kU/L
Walnut: <0.1 kU/L
Wheat IgE: <0.1 kU/L

## 2023-12-08 LAB — MILK COMPONENT PANEL RFLX
Allergen, Alpha-lactalb,f76: <0.1 kU/L
Allergen, Beta-lactoglob,f77: <0.1 kU/L
Allergen, Casein, f78: <0.1 kU/L
CLASS: 0
CLASS: 0
Class: 0

## 2023-12-08 LAB — EGG COMPONENT PANEL REFLEX
Allergen, Ovalbumin, f232: 0.1 kU/L — ABNORMAL HIGH
Allergen, Ovomucoid, f233: 0.13 kU/L — ABNORMAL HIGH

## 2023-12-08 LAB — INTERPRETATION:

## 2023-12-08 NOTE — Addendum Note (Signed)
 Addended by: Clydette Privitera on: 12/08/2023 10:46 PM   Modules accepted: Orders

## 2023-12-15 ENCOUNTER — Other Ambulatory Visit

## 2023-12-16 ENCOUNTER — Other Ambulatory Visit

## 2023-12-17 ENCOUNTER — Telehealth: Payer: Self-pay

## 2023-12-17 NOTE — Telephone Encounter (Signed)
-----   Message from [place] sent at 12/17/2023  9:15 AM EDT ----- Regarding: RE: Labs Let her know I apologize for the confusion and not letting her know about the other labs. I wanted to check her Vitamin D  also as she has been low in the past and this contributes to fatigue and poor immunity. The iron studies were to check determine if her anemia is due to low iron versus low folic acid . These should be covered by her insurance but if she does not want them I will remove them. Let me know, thx. ----- Message ----- From: Alvie Jolly, CMA Sent: 12/16/2023  10:32 AM EDT To: Victoria Gore, Victoria Dawson Subject: Labs                                           Pt had a Lab only appointment this morning to recheck TSH and T4, free. Pt states other labs were ordered and patient would like to know why these were put it. Pt rescheduled for Monday to get other labs drawn if needed. Please advise.

## 2023-12-17 NOTE — Telephone Encounter (Signed)
 I called pt and lvm in regards to PCP message below.

## 2023-12-20 ENCOUNTER — Other Ambulatory Visit (INDEPENDENT_AMBULATORY_CARE_PROVIDER_SITE_OTHER)

## 2023-12-20 ENCOUNTER — Other Ambulatory Visit: Payer: Self-pay | Admitting: *Deleted

## 2023-12-20 DIAGNOSIS — R7989 Other specified abnormal findings of blood chemistry: Secondary | ICD-10-CM

## 2023-12-20 LAB — TSH: TSH: 0.02 u[IU]/mL — ABNORMAL LOW (ref 0.35–5.50)

## 2023-12-20 LAB — T4, FREE: Free T4: 0.94 ng/dL (ref 0.60–1.60)

## 2023-12-22 ENCOUNTER — Other Ambulatory Visit: Payer: Self-pay | Admitting: Family

## 2023-12-22 DIAGNOSIS — E559 Vitamin D deficiency, unspecified: Secondary | ICD-10-CM

## 2023-12-22 DIAGNOSIS — D563 Thalassemia minor: Secondary | ICD-10-CM

## 2023-12-30 ENCOUNTER — Other Ambulatory Visit (INDEPENDENT_AMBULATORY_CARE_PROVIDER_SITE_OTHER)

## 2023-12-30 DIAGNOSIS — E559 Vitamin D deficiency, unspecified: Secondary | ICD-10-CM

## 2023-12-30 DIAGNOSIS — D563 Thalassemia minor: Secondary | ICD-10-CM

## 2023-12-31 LAB — FOLATE: Folate: 18.1 ng/mL (ref >5.9)

## 2023-12-31 LAB — VITAMIN D 25 HYDROXY (VIT D DEFICIENCY, FRACTURES): VITD: 25.74 ng/mL — ABNORMAL LOW (ref 30.00–100.00)

## 2024-01-01 LAB — IRON,TIBC AND FERRITIN PANEL
%SAT: 31 % (ref 16–45)
Ferritin: 131 ng/mL (ref 16–154)
Iron: 85 ug/dL (ref 40–190)
TIBC: 271 ug/dL (ref 250–450)

## 2024-01-04 ENCOUNTER — Ambulatory Visit: Payer: Self-pay | Admitting: Family

## 2024-01-07 ENCOUNTER — Other Ambulatory Visit: Payer: Self-pay | Admitting: Family

## 2024-02-15 ENCOUNTER — Encounter: Payer: Self-pay | Admitting: Family

## 2024-02-15 DIAGNOSIS — E0590 Thyrotoxicosis, unspecified without thyrotoxic crisis or storm: Secondary | ICD-10-CM

## 2024-02-15 NOTE — Telephone Encounter (Signed)
 let pt know referral sent, thx

## 2024-02-29 ENCOUNTER — Other Ambulatory Visit (HOSPITAL_COMMUNITY): Payer: Self-pay | Admitting: Nurse Practitioner

## 2024-02-29 DIAGNOSIS — E0590 Thyrotoxicosis, unspecified without thyrotoxic crisis or storm: Secondary | ICD-10-CM

## 2024-03-13 ENCOUNTER — Encounter (HOSPITAL_COMMUNITY)
Admission: RE | Admit: 2024-03-13 | Discharge: 2024-03-13 | Disposition: A | Discharge status: Home / Self Care | Source: Ambulatory Visit | Attending: Nurse Practitioner | Admitting: Nurse Practitioner

## 2024-03-13 ENCOUNTER — Encounter (HOSPITAL_COMMUNITY): Payer: Self-pay

## 2024-03-13 DIAGNOSIS — E0590 Thyrotoxicosis, unspecified without thyrotoxic crisis or storm: Secondary | ICD-10-CM | POA: Insufficient documentation

## 2024-03-13 MED ORDER — SODIUM IODIDE I-123 7.4 MBQ CAPS
467.0000 | ORAL_CAPSULE | Freq: Once | ORAL | Status: AC
  Administered 2024-03-13: 467 via ORAL

## 2024-03-14 ENCOUNTER — Encounter (HOSPITAL_COMMUNITY)
Admission: RE | Admit: 2024-03-14 | Discharge: 2024-03-14 | Disposition: A | Discharge status: Home / Self Care | Source: Ambulatory Visit | Attending: Nurse Practitioner | Admitting: Nurse Practitioner

## 2024-03-14 DIAGNOSIS — E0590 Thyrotoxicosis, unspecified without thyrotoxic crisis or storm: Secondary | ICD-10-CM | POA: Insufficient documentation

## 2024-05-05 ENCOUNTER — Other Ambulatory Visit: Payer: Self-pay | Admitting: Nurse Practitioner

## 2024-05-05 DIAGNOSIS — E041 Nontoxic single thyroid nodule: Secondary | ICD-10-CM

## 2024-05-11 ENCOUNTER — Ambulatory Visit
Admission: RE | Admit: 2024-05-11 | Discharge: 2024-05-11 | Disposition: A | Discharge status: Home / Self Care | Source: Ambulatory Visit | Attending: Nurse Practitioner | Admitting: Nurse Practitioner

## 2024-05-11 DIAGNOSIS — E041 Nontoxic single thyroid nodule: Secondary | ICD-10-CM

## 2024-05-26 ENCOUNTER — Other Ambulatory Visit: Payer: Self-pay | Admitting: Nurse Practitioner

## 2024-05-26 DIAGNOSIS — E041 Nontoxic single thyroid nodule: Secondary | ICD-10-CM

## 2024-05-30 NOTE — Progress Notes (Signed)
 Chief Complaint: Patient was seen in consultation today for symptomatic thyroid  nodule  Referring Physician(s): Roy Harlene HERO  History of Present Illness: Victoria Dawson is a 28 y.o. female with a medical history significant for anxiety/depression, migraines and hyperthyroidism which was recently diagnosed with abnormal labs. Additional work up including a nuclear medicine thyroid  scan and a thyroid  ultrasound revealed an autonomously functioning left thyroid  nodule. Her PCP discussed possible treatment options including radiofrequency ablation and the patient is interested in learning more. The patient presents to the Interventional Radiology clinic today for further discussion.   Thyroid  Symptom Score: 0-10  Thyroid  Cosmetic Score:  {thyroid  cosmetic score:27407}  Past Medical History:  Diagnosis Date   Anxiety    Beta thalassemia minor    Diagnosed as a child   Depression    Fibrocystic breast changes    Flank pain 11/01/2018   Kidney cyst, acquired    Kidney stone    Past 3-4 years ago   Routine general medical examination at a health care facility 11/05/2015   Wears glasses     Past Surgical History:  Procedure Laterality Date   TONSILLECTOMY      Allergies: Patient has no known allergies.  Medications: Prior to Admission medications   Medication Sig Start Date End Date Taking? Authorizing Provider  OVER THE COUNTER MEDICATION daily. NATURE MADE PRENATAL    [provider]  OVER THE COUNTER MEDICATION NATURE MADE VITAMIN C    [provider]     Family History  Problem Relation Age of Onset   Thalassemia Father    Throat cancer Father        Reported to be HPV positive   Li-Fraumeni syndrome Paternal Aunt    Ovarian cancer Paternal Aunt    Leukemia Cousin        Paternal cousin   Multiple myeloma Paternal Uncle    Thalassemia Paternal Grandfather    Dementia Paternal Grandfather     Social History   Socioeconomic  History   Marital status: Single    Spouse name: Not on file   Number of children: Not on file   Years of education: Not on file   Highest education level: Not on file  Occupational History   Not on file  Tobacco Use   Smoking status: Never   Smokeless tobacco: Never  Substance and Sexual Activity   Alcohol use: No   Drug use: No   Sexual activity: Not Currently  Other Topics Concern   Not on file  Social History Narrative   Not on file   Social Drivers of Health   Financial Resource Strain: Not on file  Food Insecurity: Not on file  Transportation Needs: Not on file  Physical Activity: Not on file  Stress: Not on file  Social Connections: Not on file     Review of Systems: A 12 point ROS discussed and pertinent positives are indicated in the HPI above.  All other systems are negative.  Vital Signs: There were no vitals taken for this visit.  Physical Exam  Imaging:  Thyroid  US  05/11/24 IMPRESSION: 3.9 cm left mid thyroid  nodule (nodule 1) meets criteria for fine-needle aspiration. This nodule corresponds to the hyperfunctioning nodule seen by thyroid  scintigraphy.   NM Thyroid  Scan 03/13/24 IMPRESSION: 1. Marked increased radiotracer uptake localizing to the mid to lower pole left lobe thyroid , compatible with autonomous nodule. Correlation with thyroid  ultrasound may be useful. 2. Elevated iodine uptake values at 4 and 24 hours.  Labs: 12/02/23 CBC WBC 6.3 9.3 7.0 9.9 7.5 R 7.8 R  12.4 R  RBC 5.40 High  5.80 High  R 5.48 High  R 5.50 High  R 5.46 High  5.52 High  R  5.93 High  R  Hemoglobin 10.6 Low  11.3 Low  10.6 Low  11.0 Low  10.8 Low  11.0 Low  R  12.1 R  HCT 33.6 Low  36.3 35.2 Low  34.0 Low  34.0 Low  34.9 R  37.7 R  MCV 62.1 Low  VC 62.6 Low  R 64.2 Low  R 61.8 Low  R 62.3 Repeated and verified X2. Low  63.2 Low  R  63.6 Low  R  Comment: Rechecked and verified result.  MCHC 31.7 31.1 30.1 32.4 31.6 31.5 R  32.2 R  RDW 15.3 16.5 High  15.9 High   14.8 14.5 R 15.6 High  R  14.2 R  Platelets 249.0 258 R, CM 224 R 218 R, CM 229.0 237 R       TFTs - 04/28/24 Serum TSH 0.028 Serum free T4 1.21 Serum T3 4.6 Thyroperoxidase Antibody <9 Thyroglobulin Antibody n/a Calcitonin n/a   Prior Thyroid  FNA: None  Assessment and Plan:  28 year old female with recently diagnosed hyperthyroidism secondary to an autonomously functioning left thyroid  nodule.    Thank you for this interesting consult.  I greatly enjoyed meeting Victoria Dawson and look forward to participating in their care.  A copy of this report was sent to the requesting provider on this date.  Ester Sides, MD Pager: [phone]    I spent a total of  40 Minutes   in face to face in clinical consultation, greater than 50% of which was counseling/coordinating care for symptomatic thyroid  nodule.

## 2024-05-31 ENCOUNTER — Inpatient Hospital Stay
Admission: RE | Admit: 2024-05-31 | Discharge: 2024-05-31 | Disposition: A | Source: Ambulatory Visit | Attending: Nurse Practitioner

## 2024-05-31 ENCOUNTER — Other Ambulatory Visit: Payer: Self-pay | Admitting: Interventional Radiology

## 2024-05-31 DIAGNOSIS — E041 Nontoxic single thyroid nodule: Secondary | ICD-10-CM

## 2024-05-31 HISTORY — PX: IR RADIOLOGIST EVAL & MGMT: IMG5224

## 2024-06-02 ENCOUNTER — Ambulatory Visit
Admission: RE | Admit: 2024-06-02 | Discharge: 2024-06-02 | Disposition: A | Discharge status: Home / Self Care | Source: Ambulatory Visit | Attending: Interventional Radiology | Admitting: Interventional Radiology

## 2024-06-02 DIAGNOSIS — E041 Nontoxic single thyroid nodule: Secondary | ICD-10-CM

## 2024-06-05 ENCOUNTER — Other Ambulatory Visit: Payer: Self-pay | Admitting: Interventional Radiology

## 2024-06-05 ENCOUNTER — Other Ambulatory Visit

## 2024-06-05 DIAGNOSIS — E041 Nontoxic single thyroid nodule: Secondary | ICD-10-CM

## 2024-07-03 ENCOUNTER — Ambulatory Visit
Admission: RE | Admit: 2024-07-03 | Discharge: 2024-07-03 | Disposition: A | Discharge status: Home / Self Care | Source: Ambulatory Visit | Attending: Interventional Radiology | Admitting: Interventional Radiology

## 2024-07-03 DIAGNOSIS — E041 Nontoxic single thyroid nodule: Secondary | ICD-10-CM

## 2024-07-10 NOTE — Progress Notes (Signed)
 This encounter was conducted via the [employer] providing interactive audio and visual communication.  The patient provided verbal consent to conduct a virtual appointment.  The patient was located at their primary residence during this encounter.  Referring Physician(s): Roy Harlene HERO   Chief Complaint: The patient is seen in virtual video follow up today s/p left thyroid  nodule ablation 06/02/24  History of present illness: HPI from initial consultation 05/31/24 Victoria Dawson is a 28 y.o. female with a medical history significant for anxiety/depression, migraines and hyperthyroidism which was recently diagnosed with abnormal labs. Additional work up including a nuclear medicine thyroid  scan and a thyroid  ultrasound revealed an autonomously functioning left thyroid  nodule. Her PCP discussed possible treatment options including radiofrequency ablation and the patient is interested in learning more. The patient presents to the Interventional Radiology clinic today for further discussion.    She has mild hyperthyroid symptoms including occasional palpitations and increased anxiety. No weight loss. She is not on methimazole. She has compressive symptoms from her nodule including tenderness, pressure in her left neck especially when turning her head, some mild dysphagia, and also dysphonia and voice weakness.     Thyroid  Cosmetic Score:  Readily detected cosmetic problem (Grade 4).  We discussed radiofrequency ablation for treatment of these nodules, and additionally other options such as hemithyroidectomy or radioactive iodine treatment. She wished to avoid surgery and was most interested in radiofrequency ablation. We discussed that for larger nodules like hers, the efficacy is slightly lower, and may require additional sessions of ablation. She was agreeable to proceed. She presented to the clinic 06/02/24 and underwent a technically successful left thyroid  nodule RFA. A  follow up ultrasound was performed 07/03/24 and she presents today via virtual video visit for follow up.    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  Medications: Prior to Admission medications   Medication Sig Start Date End Date Taking? Authorizing Provider  OVER THE COUNTER MEDICATION daily. NATURE MADE PRENATAL    [provider]  OVER THE COUNTER MEDICATION NATURE MADE VITAMIN C    [provider]     Family History  Problem Relation Age of Onset   Thalassemia Father    Throat cancer Father        Reported to be HPV positive   Li-Fraumeni syndrome Paternal Aunt    Ovarian cancer Paternal Aunt    Leukemia Cousin        Paternal cousin   Multiple myeloma Paternal Uncle    Thalassemia Paternal Grandfather    Dementia Paternal Grandfather     Social History   Socioeconomic History   Marital status: Single    Spouse name: Not on file   Number of children: Not on file   Years of education: Not on file   Highest education level: Not on file  Occupational History   Not on file  Tobacco Use   Smoking status:  Never   Smokeless tobacco: Never  Substance and Sexual Activity   Alcohol use: No   Drug use: No   Sexual activity: Not Currently  Other Topics Concern   Not on file  Social History Narrative   Not on file   Social Drivers of Health   Financial Resource Strain: Not on file  Food Insecurity: Not on file  Transportation Needs: Not on file  Physical Activity: Not on file  Stress: Not on file   Social Connections: Not on file     Vital Signs: There were no vitals taken for this visit.  Physical Exam  Patient is alert, oriented and able to participate fully in the conversation. No apparent discomfort or distress observed. She appears appropriately dressed.   Imaging:  Thyroid  US  05/11/24 IMPRESSION: 3.9 cm left mid thyroid  nodule (nodule 1) meets criteria for fine-needle aspiration. This nodule corresponds to the hyperfunctioning nodule seen by thyroid  scintigraphy.  Thyroid  US  07/03/24  IMPRESSION: 1. Dominant mid left nodule 30% decrease in volume post ablation. 2. Superior left nodule measuring 1.7 x 1.4 x 1.3 cm; TI-RADS 3, recommend annual surveillance.   NM Thyroid  Scan 03/13/24 IMPRESSION: 1. Marked increased radiotracer uptake localizing to the mid to lower pole left lobe thyroid , compatible with autonomous nodule. Correlation with thyroid  ultrasound may be useful. 2. Elevated iodine uptake values at 4 and 24 hours.  Labs:  CBC: Recent Labs    10/17/23 1942 12/02/23 1010  WBC 9.3 6.3  HGB 11.3* 10.6*  HCT 36.3 33.6*  PLT 258 249.0    BMP: Recent Labs    10/17/23 1942 12/02/23 1010  NA 135 138  K 3.9 4.1  CL 102 105  CO2 25 26  GLUCOSE 105* 94  BUN 7 14  CALCIUM 10.0 9.5  CREATININE 0.76 0.67  GFRNONAA >60  --     LIVER FUNCTION TESTS: Recent Labs    12/02/23 1010  BILITOT 0.9  AST 18  ALT 20  ALKPHOS 45  PROT 7.1  ALBUMIN 4.5    TFTs - 04/28/24 Serum TSH 0.028 Serum free T4 1.21 Serum T3 4.6 Thyroperoxidase Antibody <9 Thyroglobulin Antibody n/a Calcitonin n/a   Prior Thyroid  FNA: None  Assessment and Plan:  28 year old female with recently diagnosed hyperthyroidism secondary to an autonomously functioning left thyroid  nodule which also causes compressive and cosmetic symptoms. She wished to avoid surgery and was most interested in radiofrequency ablation. She underwent a technically successful radiofrequency  ablation of the left nodule 06/02/24. A follow up ultrasound demonstrated a 30% reduction in size.   Electronically Signed: Warren JONELLE Dais 07/10/2024, 12:04 PM   I spent a total of 25 Minutes in virtual video clinical consultation, greater than 50% of which was counseling/coordinating care for symptomatic thyroid  nodule.

## 2024-07-12 ENCOUNTER — Inpatient Hospital Stay
Admission: RE | Admit: 2024-07-12 | Discharge: 2024-07-12 | Disposition: A | Source: Ambulatory Visit | Attending: Interventional Radiology

## 2024-07-12 DIAGNOSIS — E041 Nontoxic single thyroid nodule: Secondary | ICD-10-CM

## 2024-07-12 HISTORY — PX: IR RADIOLOGIST EVAL & MGMT: IMG5224

## 2024-08-17 ENCOUNTER — Telehealth: Payer: Self-pay

## 2024-08-17 NOTE — Telephone Encounter (Signed)
 Copied from CRM #[id number]. Topic: Appointments - Transfer of Care >> Aug 17, 2024  9:22 AM Jeoffrey H wrote: Pt is requesting to transfer FROM: Dr. Corean Comment Pt is requesting to transfer TO: Dr. Jenkins Carrel Reason for requested transfer: Wants new pcp  It is the responsibility of the team the patient would like to transfer to (Dr. Dr. Jenkins Carrel) to reach out to the patient if for any reason this transfer is not acceptable.

## 2024-09-26 ENCOUNTER — Inpatient Hospital Stay: Admitting: Physician Assistant

## 2024-09-26 ENCOUNTER — Inpatient Hospital Stay

## 2024-09-28 ENCOUNTER — Encounter: Admitting: Family Medicine

## 2024-12-05 ENCOUNTER — Encounter: Admitting: Family

## 2024-12-07 ENCOUNTER — Encounter: Admitting: Family Medicine
